# Patient Record
Sex: Male | Born: 1975 | Race: Black or African American | Hispanic: No | Marital: Single | Smoking: Former smoker
Health system: Southern US, Community
[De-identification: ages and names within clinical notes are randomized; demographics above are authoritative.]

## PROBLEM LIST (undated history)

## (undated) DIAGNOSIS — L0291 Cutaneous abscess, unspecified: Secondary | ICD-10-CM

---

## 2001-02-11 ENCOUNTER — Encounter: Payer: Self-pay | Admitting: General Surgery

## 2001-02-11 ENCOUNTER — Inpatient Hospital Stay (HOSPITAL_COMMUNITY): Admission: EM | Admit: 2001-02-11 | Discharge: 2001-02-23 | Payer: Self-pay

## 2001-02-12 ENCOUNTER — Encounter: Payer: Self-pay | Admitting: General Surgery

## 2001-02-13 ENCOUNTER — Encounter: Payer: Self-pay | Admitting: General Surgery

## 2001-02-14 ENCOUNTER — Encounter: Payer: Self-pay | Admitting: General Surgery

## 2001-02-15 ENCOUNTER — Encounter: Payer: Self-pay | Admitting: General Surgery

## 2001-02-16 ENCOUNTER — Encounter: Payer: Self-pay | Admitting: General Surgery

## 2001-02-17 ENCOUNTER — Encounter: Payer: Self-pay | Admitting: General Surgery

## 2001-02-18 ENCOUNTER — Encounter: Payer: Self-pay | Admitting: General Surgery

## 2001-03-01 ENCOUNTER — Encounter: Admission: RE | Admit: 2001-03-01 | Discharge: 2001-04-08 | Payer: Self-pay | Admitting: General Surgery

## 2001-03-02 ENCOUNTER — Ambulatory Visit (HOSPITAL_COMMUNITY): Admission: RE | Admit: 2001-03-02 | Discharge: 2001-03-02 | Payer: Self-pay

## 2001-03-07 ENCOUNTER — Encounter: Payer: Self-pay | Admitting: Neurosurgery

## 2001-03-07 ENCOUNTER — Encounter: Admission: RE | Admit: 2001-03-07 | Discharge: 2001-03-07 | Payer: Self-pay | Admitting: Neurosurgery

## 2001-04-22 ENCOUNTER — Encounter: Admission: RE | Admit: 2001-04-22 | Discharge: 2001-07-21 | Payer: Self-pay | Admitting: General Surgery

## 2001-09-13 ENCOUNTER — Encounter: Admission: RE | Admit: 2001-09-13 | Discharge: 2001-09-13 | Payer: Self-pay | Admitting: Neurosurgery

## 2001-09-13 ENCOUNTER — Encounter: Payer: Self-pay | Admitting: Neurosurgery

## 2011-06-20 ENCOUNTER — Emergency Department (HOSPITAL_COMMUNITY)
Admission: EM | Admit: 2011-06-20 | Discharge: 2011-06-20 | Disposition: A | Discharge status: Home / Self Care | Payer: Medicare Other | Attending: Emergency Medicine | Admitting: Emergency Medicine

## 2011-06-20 DIAGNOSIS — R109 Unspecified abdominal pain: Secondary | ICD-10-CM | POA: Insufficient documentation

## 2011-06-20 DIAGNOSIS — R1909 Other intra-abdominal and pelvic swelling, mass and lump: Secondary | ICD-10-CM | POA: Insufficient documentation

## 2011-06-20 DIAGNOSIS — L02219 Cutaneous abscess of trunk, unspecified: Secondary | ICD-10-CM | POA: Insufficient documentation

## 2012-03-16 ENCOUNTER — Emergency Department (HOSPITAL_COMMUNITY)
Admission: EM | Admit: 2012-03-16 | Discharge: 2012-03-16 | Disposition: A | Discharge status: Home / Self Care | Payer: Medicare Other | Attending: Emergency Medicine | Admitting: Emergency Medicine

## 2012-03-16 ENCOUNTER — Encounter (HOSPITAL_COMMUNITY): Payer: Self-pay | Admitting: *Deleted

## 2012-03-16 DIAGNOSIS — L02412 Cutaneous abscess of left axilla: Secondary | ICD-10-CM

## 2012-03-16 DIAGNOSIS — IMO0002 Reserved for concepts with insufficient information to code with codable children: Secondary | ICD-10-CM | POA: Insufficient documentation

## 2012-03-16 DIAGNOSIS — F17200 Nicotine dependence, unspecified, uncomplicated: Secondary | ICD-10-CM | POA: Insufficient documentation

## 2012-03-16 MED ORDER — HYDROCODONE-ACETAMINOPHEN 5-325 MG PO TABS: 1.0000 | ORAL_TABLET | ORAL | Status: AC | PRN

## 2012-03-16 MED ORDER — SULFAMETHOXAZOLE-TRIMETHOPRIM 800-160 MG PO TABS: 2.0000 | ORAL_TABLET | Freq: Two times a day (BID) | ORAL | Status: AC

## 2012-03-16 MED ORDER — CEPHALEXIN 500 MG PO CAPS: 500.0000 mg | ORAL_CAPSULE | Freq: Four times a day (QID) | ORAL | Status: AC

## 2012-03-16 NOTE — ED Notes (Signed)
Pt is here with abscess under left axilla for one week and not draining.   Oval shaped and red

## 2012-03-16 NOTE — Discharge Instructions (Signed)
Take antibiotic as prescribed.  Take vicodin as prescribed for severe pain.   Do not drive within four hours of taking this medication (may cause drowsiness or confusion).  Take tylenol for mild-moderate pain.  Follow up with your primary care doctor in 2 days for wound recheck and packing removal.  You should return sooner if the redness around the wound continues to spread despite taking the antibiotic. Abscess An abscess (boil or furuncle) is an infected area that contains a collection of pus.  SYMPTOMS Signs and symptoms of an abscess include pain, tenderness, redness, or hardness. You may feel a moveable soft area under your skin. An abscess can occur anywhere in the body.  TREATMENT  A surgical cut (incision) may be made over your abscess to drain the pus. Gauze may be packed into the space or a drain may be looped through the abscess cavity (pocket). This provides a drain that will allow the cavity to heal from the inside outwards. The abscess may be painful for a few days, but should feel much better if it was drained.  Your abscess, if seen early, may not have localized and may not have been drained. If not, another appointment may be required if it does not get better on its own or with medications. HOME CARE INSTRUCTIONS   Only take over-the-counter or prescription medicines for pain, discomfort, or fever as directed by your caregiver.   Take your antibiotics as directed if they were prescribed. Finish them even if you start to feel better.   Keep the skin and clothes clean around your abscess.   If the abscess was drained, you will need to use gauze dressing to collect any draining pus. Dressings will typically need to be changed 3 or more times a day.   The infection may spread by skin contact with others. Avoid skin contact as much as possible.   Practice good hygiene. This includes regular hand washing, cover any draining skin lesions, and do not share personal care items.   If  you participate in sports, do not share athletic equipment, towels, whirlpools, or personal care items. Shower after every practice or tournament.   If a draining area cannot be adequately covered:   Do not participate in sports.   Children should not participate in day care until the wound has healed or drainage stops.   If your caregiver has given you a follow-up appointment, it is very important to keep that appointment. Not keeping the appointment could result in a much worse infection, chronic or permanent injury, pain, and disability. If there is any problem keeping the appointment, you must call back to this facility for assistance.  SEEK MEDICAL CARE IF:   You develop increased pain, swelling, redness, drainage, or bleeding in the wound site.   You develop signs of generalized infection including muscle aches, chills, fever, or a general ill feeling.   You have an oral temperature above 102 F (38.9 C).  MAKE SURE YOU:   Understand these instructions.   Will watch your condition.   Will get help right away if you are not doing well or get worse.  Document Released: 09/16/2005 Document Revised: 11/26/2011 Document Reviewed: 07/10/2008 [facility] Patient Information 2012 [place], [place].Cellulitis Cellulitis is an infection of the skin and the tissue beneath it. The area is typically red and tender. It is caused by germs (bacteria) (usually staph or strep) that enter the body through cuts or sores. Cellulitis most commonly occurs in the arms or  lower legs.  HOME CARE INSTRUCTIONS   If you are given a prescription for medications which kill germs (antibiotics), take as directed until finished.   If the infection is on the arm or leg, keep the limb elevated as able.   Use a warm cloth several times per day to relieve pain and encourage healing.   See your caregiver for recheck of the infected site as directed if problems arise.   Only take over-the-counter or prescription  medicines for pain, discomfort, or fever as directed by your caregiver.  SEEK MEDICAL CARE IF:   The area of redness (inflammation) is spreading, there are red streaks coming from the infected site, or if a part of the infection begins to turn dark in color.   The joint or bone underneath the infected skin becomes painful after the skin has healed.   The infection returns in the same or another area after it seems to have gone away.   A boil or bump swells up. This may be an abscess.   New, unexplained problems such as pain or fever develop.  SEEK IMMEDIATE MEDICAL CARE IF:   You have a fever.   You or your child feels drowsy or lethargic.   There is vomiting, diarrhea, or lasting discomfort or feeling ill (malaise) with muscle aches and pains.  MAKE SURE YOU:   Understand these instructions.   Will watch your condition.   Will get help right away if you are not doing well or get worse.  Document Released: 09/16/2005 Document Revised: 11/26/2011 Document Reviewed: 07/25/2008 [facility] Patient Information 2012 [place], [place].

## 2012-03-16 NOTE — ED Provider Notes (Signed)
History     CSN: [id number]  Arrival date & time 03/16/12  4098   First MD Initiated Contact with Patient 03/16/12 [phone]      Chief Complaint  Patient presents with  . Abscess    (Consider location/radiation/quality/duration/timing/severity/associated sxs/prior treatment) HPI History provided by pt.   Pt presents w/ abscess of left axilla x 1-2 weeks.  Severely, constantly painful, particularly w/ palpation, but mild improvement w/ tylenol.  Has applied warm compresses but no drainage.  No associated fever.  Otherwise feeling well.    History reviewed. No pertinent past medical history.  History reviewed. No pertinent past surgical history.  No family history on file.  History  Substance Use Topics  . Smoking status: Current Everyday Smoker  . Smokeless tobacco: Not on file  . Alcohol Use:      occ      Review of Systems  All other systems reviewed and are negative.    Allergies  Review of patient's allergies indicates no known allergies.  Home Medications   Current Outpatient Rx  Name Route Sig Dispense Refill  . CEPHALEXIN 500 MG PO CAPS Oral Take 1 capsule (500 mg total) by mouth 4 (four) times daily. 28 capsule 0  . HYDROCODONE-ACETAMINOPHEN 5-325 MG PO TABS Oral Take 1 tablet by mouth every 4 (four) hours as needed for pain. 12 tablet 0  . SULFAMETHOXAZOLE-TRIMETHOPRIM 800-160 MG PO TABS Oral Take 2 tablets by mouth 2 (two) times daily. 28 tablet 0    BP 138/88  Pulse 81  Temp(Src) 97 F (36.1 C) (Oral)  Resp 18  SpO2 98%  Physical Exam  Nursing note and vitals reviewed. Constitutional: He is oriented to person, place, and time. He appears well-developed and well-nourished. No distress.  HENT:  Head: Normocephalic and atraumatic.  Eyes:       Normal appearance  Neck: Normal range of motion.  Neurological: He is alert and oriented to person, place, and time.  Skin:       2.5cm abscess left axilla w/ surrounding induration and approx 4 inches  surrounding erythema.    Psychiatric: He has a normal mood and affect. His behavior is normal.    ED Course  Procedures (including critical care time)  INCISION AND DRAINAGE Performed by: Otilio Miu Consent: Verbal consent obtained. Risks and benefits: risks, benefits and alternatives were discussed Type: abscess  Body area: left axilla  Anesthesia: local infiltration  Local anesthetic: lidocaine 2% w/ epinephrine  Anesthetic total: 6 ml  Complexity: complex Blunt dissection to break up loculations  Drainage: purulent  Drainage amount: small  Packing material: 1/4 in iodoform gauze  Patient tolerance: Patient tolerated the procedure well with no immediate complications.    Labs Reviewed - No data to display No results found.   1. Abscess of left axilla       MDM  Pt presents w/ abscess w/ surrounding cellulitis of left axilla.  Abscess I&D'd but very little drainage.  Pt has a PCP to f/u with in 2 days.  Prescribed bactrim/keflex as well as vicodin and return precautions discussed.          Otilio Miu, [place] 03/16/12 [phone]

## 2012-03-16 NOTE — ED Provider Notes (Signed)
Medical screening examination/treatment/procedure(s) were performed by non-physician practitioner and as supervising physician I was immediately available for consultation/collaboration.   Laray Anger, DO 03/16/12 2110

## 2012-12-13 ENCOUNTER — Emergency Department (HOSPITAL_COMMUNITY)
Admission: EM | Admit: 2012-12-13 | Discharge: 2012-12-13 | Disposition: A | Discharge status: Home / Self Care | Payer: Medicare Other | Attending: Emergency Medicine | Admitting: Emergency Medicine

## 2012-12-13 ENCOUNTER — Encounter (HOSPITAL_COMMUNITY): Payer: Self-pay | Admitting: Physical Medicine and Rehabilitation

## 2012-12-13 DIAGNOSIS — N509 Disorder of male genital organs, unspecified: Secondary | ICD-10-CM | POA: Insufficient documentation

## 2012-12-13 DIAGNOSIS — N492 Inflammatory disorders of scrotum: Secondary | ICD-10-CM

## 2012-12-13 DIAGNOSIS — F17200 Nicotine dependence, unspecified, uncomplicated: Secondary | ICD-10-CM | POA: Insufficient documentation

## 2012-12-13 DIAGNOSIS — N498 Inflammatory disorders of other specified male genital organs: Secondary | ICD-10-CM | POA: Insufficient documentation

## 2012-12-13 MED ORDER — CEPHALEXIN 500 MG PO CAPS: 500.0000 mg | ORAL_CAPSULE | Freq: Three times a day (TID) | ORAL | Status: DC

## 2012-12-13 MED ORDER — SULFAMETHOXAZOLE-TRIMETHOPRIM 800-160 MG PO TABS: 1.0000 | ORAL_TABLET | Freq: Three times a day (TID) | ORAL | Status: DC

## 2012-12-13 MED ORDER — OXYCODONE-ACETAMINOPHEN 5-325 MG PO TABS: 1.0000 | ORAL_TABLET | Freq: Four times a day (QID) | ORAL | Status: DC | PRN

## 2012-12-13 NOTE — ED Notes (Signed)
Pt presents to department for evaluation of bilateral testicular swelling. Ongoing x1 week. States pain became worse today. 10/10 at the time. Denies urinary symptoms.

## 2012-12-13 NOTE — ED Notes (Signed)
EDP at bedside. I&D of scrotum performed, pt tolerated without difficulty. Wound care discussed. Pt instructed to follow up with urologist x2 days.

## 2012-12-13 NOTE — ED Provider Notes (Signed)
History     CSN: [id number]  Arrival date & time 12/13/12  4098   First MD Initiated Contact with Patient 12/13/12 [phone]      Chief Complaint  Patient presents with  . Groin Swelling    (Consider location/radiation/quality/duration/timing/severity/associated sxs/prior treatment) The history is provided by the patient.   patient with scrotal swelling for the last week. Started on the left he states now involves the right. No trauma. There is a swollen area to the left side. No fevers. No dysuria. No penile discharge. No trauma. He has not had episodes like this before.  No past medical history on file.  No past surgical history on file.  History reviewed. No pertinent family history.  History  Substance Use Topics  . Smoking status: Current Some Day Smoker    Types: Cigarettes  . Smokeless tobacco: Not on file  . Alcohol Use: No     Comment: occ      Review of Systems  Constitutional: Negative for activity change and appetite change.  HENT: Negative for neck stiffness.   Eyes: Negative for pain.  Respiratory: Negative for chest tightness and shortness of breath.   Cardiovascular: Negative for chest pain and leg swelling.  Gastrointestinal: Negative for nausea, vomiting, abdominal pain and diarrhea.  Genitourinary: Positive for scrotal swelling. Negative for hematuria, flank pain, penile pain and testicular pain.  Musculoskeletal: Negative for back pain.  Skin: Negative for rash.  Neurological: Negative for weakness, numbness and headaches.  Psychiatric/Behavioral: Negative for behavioral problems.    Allergies  Review of patient's allergies indicates no known allergies.  Home Medications   Current Outpatient Rx  Name  Route  Sig  Dispense  Refill  . CEPHALEXIN 500 MG PO CAPS   Oral   Take 1 capsule (500 mg total) by mouth 3 (three) times daily.   15 capsule   0   . OXYCODONE-ACETAMINOPHEN 5-325 MG PO TABS   Oral   Take 1-2 tablets by mouth every 6 (six)  hours as needed for pain.   10 tablet   0   . SULFAMETHOXAZOLE-TRIMETHOPRIM 800-160 MG PO TABS   Oral   Take 1 tablet by mouth 3 (three) times daily.   15 tablet   0     BP 147/89  Pulse 81  Temp 98.8 F (37.1 C) (Oral)  Resp 18  SpO2 97%  Physical Exam  Nursing note and vitals reviewed. Constitutional: He is oriented to person, place, and time. He appears well-developed and well-nourished.  HENT:  Head: Normocephalic and atraumatic.  Eyes: EOM are normal. Pupils are equal, round, and reactive to light.  Neck: Normal range of motion. Neck supple.  Cardiovascular: Normal rate, regular rhythm and normal heart sounds.   No murmur heard. Pulmonary/Chest: Effort normal and breath sounds normal.  Abdominal: Soft. Bowel sounds are normal. He exhibits no distension and no mass. There is no tenderness. There is no rebound and no guarding.  Genitourinary:       2 cm x 4 cm tender indurated area to left proximal scrotum. There is approximately 1.56 cm fluctuant area. No drainage. Bilateral testicles have apparent normal lie and are nontender. No palpated hernias.  Musculoskeletal: Normal range of motion. He exhibits no edema.  Neurological: He is alert and oriented to person, place, and time. No cranial nerve deficit.  Skin: Skin is warm and dry.  Psychiatric: He has a normal mood and affect.    ED Course  Procedures (including critical care time)  Labs Reviewed - No data to display No results found.   1. Abscess of scrotum   2. Cellulitis of scrotum    INCISION AND DRAINAGE Performed by: Billee Cashing. Consent: Verbal consent obtained. Risks and benefits: risks, benefits and alternatives were discussed Type: abscess  Body area: Left scrotum  Anesthesia: local infiltration  Incision was made with a scalpel.  Local anesthetic: lidocaine 2 % Anesthetic total: 2 ml  Complexity: complex Blunt dissection to break up loculations  Drainage: purulent  Drainage  amount: Mild   Patient tolerance: Patient tolerated the procedure well with no immediate complications.     MDM  Patient with pain and swelling of left scrotal area. Apparent abscess with surrounding cellulitis. Testicle appears nontender. No palpated hernias. Patient had an incision and drainage of the abscess. Antibiotics and pain that started the patient will follow with urology in 2 days.        Juliet Rude. Rubin Payor, MD 12/13/12 1040

## 2013-01-11 ENCOUNTER — Encounter (HOSPITAL_COMMUNITY): Payer: Self-pay | Admitting: Cardiology

## 2013-01-11 ENCOUNTER — Emergency Department (HOSPITAL_COMMUNITY): Payer: Medicare Other

## 2013-01-11 ENCOUNTER — Emergency Department (HOSPITAL_COMMUNITY)
Admission: EM | Admit: 2013-01-11 | Discharge: 2013-01-11 | Disposition: A | Discharge status: Home / Self Care | Payer: Medicare Other | Attending: Emergency Medicine | Admitting: Emergency Medicine

## 2013-01-11 DIAGNOSIS — F17200 Nicotine dependence, unspecified, uncomplicated: Secondary | ICD-10-CM | POA: Insufficient documentation

## 2013-01-11 DIAGNOSIS — N492 Inflammatory disorders of scrotum: Secondary | ICD-10-CM

## 2013-01-11 DIAGNOSIS — N498 Inflammatory disorders of other specified male genital organs: Secondary | ICD-10-CM | POA: Insufficient documentation

## 2013-01-11 MED ORDER — CEPHALEXIN 500 MG PO CAPS: ORAL_CAPSULE | ORAL | Status: DC

## 2013-01-11 MED ORDER — MORPHINE SULFATE 4 MG/ML IJ SOLN
4.0000 mg | Freq: Once | INTRAMUSCULAR | Status: AC
  Administered 2013-01-11: 4 mg via INTRAVENOUS
  Filled 2013-01-11: qty 1

## 2013-01-11 MED ORDER — PERCOCET 5-325 MG PO TABS: 1.0000 | ORAL_TABLET | Freq: Four times a day (QID) | ORAL | Status: DC | PRN

## 2013-01-11 MED ORDER — VANCOMYCIN HCL IN DEXTROSE 1-5 GM/200ML-% IV SOLN
1000.0000 mg | Freq: Once | INTRAVENOUS | Status: AC
  Administered 2013-01-11: 1000 mg via INTRAVENOUS
  Filled 2013-01-11: qty 200

## 2013-01-11 MED ORDER — ONDANSETRON HCL 4 MG/2ML IJ SOLN
4.0000 mg | Freq: Once | INTRAMUSCULAR | Status: AC
  Administered 2013-01-11: 4 mg via INTRAVENOUS
  Filled 2013-01-11: qty 2

## 2013-01-11 NOTE — ED Provider Notes (Signed)
History     CSN: [id number]  Arrival date & time 01/11/13  0808   First MD Initiated Contact with Patient 01/11/13 [phone]      Chief Complaint  Patient presents with  . Abcess     (Consider location/radiation/quality/duration/timing/severity/associated sxs/prior treatment) HPI Comments: Logan Rodriguez is a 37 y.o. male presents emergency department complaining of left testicular pain.  The patient was evaluated for similar presentation on December 24.  At that time incision and drainage was performed with only scant draining.  He was placed on antibiotics and was to followup with urology which the patient was noncompliant as he was feeling better from pain medication and antibiotics.  Onset of current presentation began 2-3 days ago.  No known exacerbating or alleviating factors.  Symptoms are moderate.  Patient denies radiation of pain.  Patient reports that it feels similar to last time.  He denies any drainage, penile discharge, penile tenderness, hematuria, urinary frequency, fevers, night sweats or chills.  The history is provided by the patient.    History reviewed. No pertinent past medical history.  History reviewed. No pertinent past surgical history.  History reviewed. No pertinent family history.  History  Substance Use Topics  . Smoking status: Current Some Day Smoker    Types: Cigarettes  . Smokeless tobacco: Not on file  . Alcohol Use: No     Comment: occ      Review of Systems  Constitutional: Negative for fever, diaphoresis and activity change.  HENT: Negative for congestion and neck pain.   Respiratory: Negative for cough.   Genitourinary: Negative for dysuria.  Musculoskeletal: Negative for myalgias.  Skin: Negative for color change and wound.  Neurological: Negative for headaches.  All other systems reviewed and are negative.    Allergies  Review of patient's allergies indicates no known allergies.  Home Medications  No current outpatient  prescriptions on file.  BP 154/88  Pulse 91  Temp 97.7 F (36.5 C) (Oral)  Resp 18  SpO2 97%  Physical Exam  Nursing note and vitals reviewed. Constitutional: He is oriented to person, place, and time. He appears well-developed and well-nourished. No distress.  HENT:  Head: Normocephalic and atraumatic.  Eyes: Conjunctivae normal and EOM are normal.  Neck: Normal range of motion.  Pulmonary/Chest: Effort normal.  Genitourinary:       Exam chaperoned.  Penis normal and circumcised.  2 x 1 cm tender indurated area at the left proximal scrotum with I&D scar above.  No area of palpable fluctuance.  No drainage.  Bilateral testicles nontender with intact cremaster reflex.  No palpable inguinal hernias.  Musculoskeletal: Normal range of motion.  Neurological: He is alert and oriented to person, place, and time.  Skin: Skin is warm and dry. No rash noted. He is not diaphoretic.  Psychiatric: He has a normal mood and affect. His behavior is normal.    ED Course  Procedures (including critical care time)  Labs Reviewed - No data to display US Scrotum  01/11/2013  *RADIOLOGY REPORT*  Clinical Data:  Scrotal pain and swelling.  SCROTAL ULTRASOUND DOPPLER ULTRASOUND OF THE TESTICLES  Technique: Complete ultrasound examination of the testicles, epididymis, and other scrotal structures was performed.  Color and spectral Doppler ultrasound were also utilized to evaluate blood flow to the testicles.  Comparison:  None  Findings:  Right testis:  Measures 3.8 x 2.1 x 3.1cm.  Normal homogeneous echogenicity without focal lesions.  Patent intratesticular blood flow.  There is a small,  2.1 mm, tunica albuginea cyst.  Left testis:  Measures 3.4 x 1.9 x 3.4cm.  Normal homogeneous echogenicity without focal lesions.  Patent intratesticular blood flow  Right epididymis:  Normal in size and appearance.  Left epididymis:  Normal in size and appearance.  Hydrocele:  None  Varicocele:  None  Pulsed Doppler  interrogation of both testes demonstrates low resistance flow bilaterally.  IMPRESSION:  1.  Normal sonographic appearance of both testicles except for a small tunica albuginea cyst on the right. 2.  Patent intra testicular blood flow bilaterally. 3.  Normal sonographic appearance of the epididymi. 4.  No hydrocele or varicocele.   Original Report Authenticated By: Rudie Meyer, M.D.    [name] Art/ven Flow Abd Pelv Doppler  01/11/2013  *RADIOLOGY REPORT*  Clinical Data:  Scrotal pain and swelling.  SCROTAL ULTRASOUND DOPPLER ULTRASOUND OF THE TESTICLES  Technique: Complete ultrasound examination of the testicles, epididymis, and other scrotal structures was performed.  Color and spectral Doppler ultrasound were also utilized to evaluate blood flow to the testicles.  Comparison:  None  Findings:  Right testis:  Measures 3.8 x 2.1 x 3.1cm.  Normal homogeneous echogenicity without focal lesions.  Patent intratesticular blood flow.  There is a small, 2.1 mm, tunica albuginea cyst.  Left testis:  Measures 3.4 x 1.9 x 3.4cm.  Normal homogeneous echogenicity without focal lesions.  Patent intratesticular blood flow  Right epididymis:  Normal in size and appearance.  Left epididymis:  Normal in size and appearance.  Hydrocele:  None  Varicocele:  None  Pulsed Doppler interrogation of both testes demonstrates low resistance flow bilaterally.  IMPRESSION:  1.  Normal sonographic appearance of both testicles except for a small tunica albuginea cyst on the right. 2.  Patent intra testicular blood flow bilaterally. 3.  Normal sonographic appearance of the epididymi. 4.  No hydrocele or varicocele.   Original Report Authenticated By: Rudie Meyer, M.D.      No diagnosis found.  Consult Urology: Dr. Sherron Monday ultrasound of scrotum and IV dose of vancomycin while in the emergency department.  Consult was called back after resulted without acute abnormalities that were concerning.  Patient is to be discharged on Keflex with  pain medication and followup with urology on Friday.  BP 154/88  Pulse 91  Temp 97.7 F (36.5 C) (Oral)  Resp 18  SpO2 97%  MDM  Abscess, no fluctuance  37 year old nonseptic and nontoxic appearing male to the emergency department complaining of left testicular pain.  Area of induration noted on exam and left proximal scrotum, however there is no area of fluctuance or draining.  Ultrasound reviewed as above.  Consults urology as above.  Patient has been instructed that if he has worsening symptoms or drainage develops that he needs to call Dr. Lafayette Dragon office to schedule an earlier followup appointment.  Patient is also been educated on home care therapies including warm compresses 3 times daily.  Questions answered.        Jaci Carrel, [place] 01/11/13 1011

## 2013-01-11 NOTE — ED Notes (Signed)
Pt reports abcess with swelling to the left groin. States he was here prior to Christmas with the same symptoms. Reports swelling returned over the past couple of days.

## 2013-01-11 NOTE — ED Provider Notes (Signed)
Medical screening examination/treatment/procedure(s) were performed by non-physician practitioner and as supervising physician I was immediately available for consultation/collaboration.  Audriana Aldama R. Bengie Kaucher, MD 01/11/13 1602 

## 2013-03-31 IMAGING — US US ART/VEN ABD/PELV/SCROTUM DOPPLER LTD
1 series · 14 of 25 positions shown · non-contrast
Comparison: None

CLINICAL DATA: Scrotal pain and swelling.

SCROTAL ULTRASOUND
DOPPLER ULTRASOUND OF THE TESTICLES
TECHNIQUE: Complete ultrasound examination of the testicles,
epididymis, and other scrotal structures was performed.  Color and
spectral Doppler ultrasound were also utilized to evaluate blood
flow to the testicles.

[Series 1: us art/ven abd/pelv/scrotum doppler ltd · 0.08mm/px · 14 of 52 slices shown]
[im 1/52]
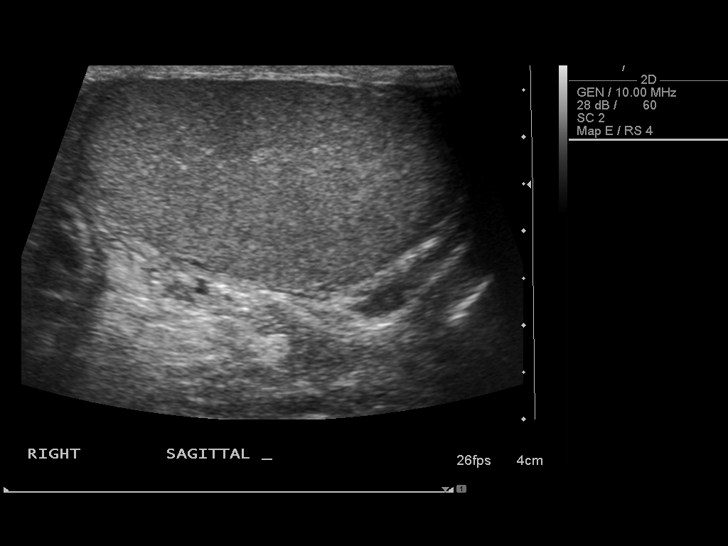
[im 5/52]
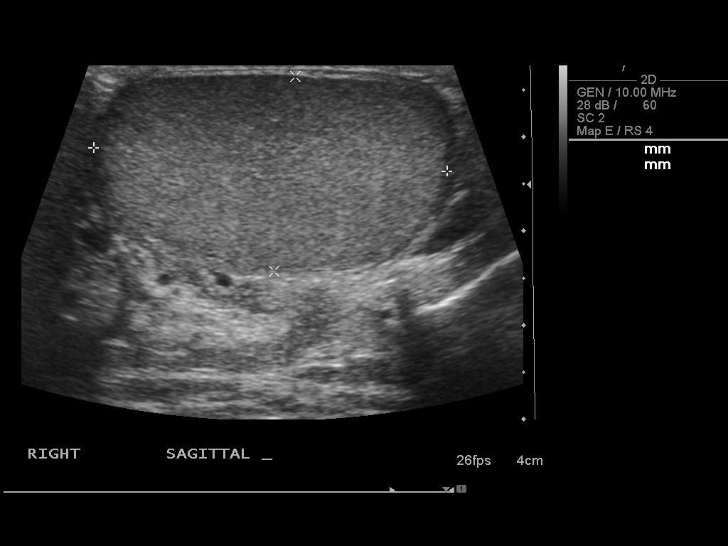
[im 9/52]
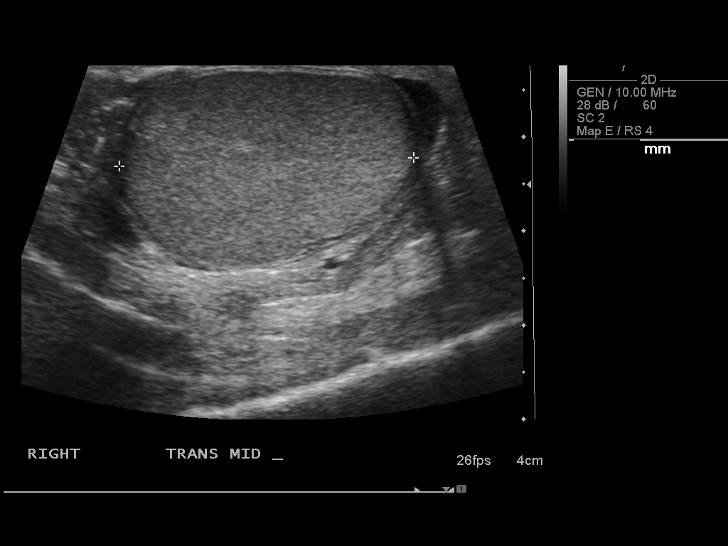
[im 13/52]
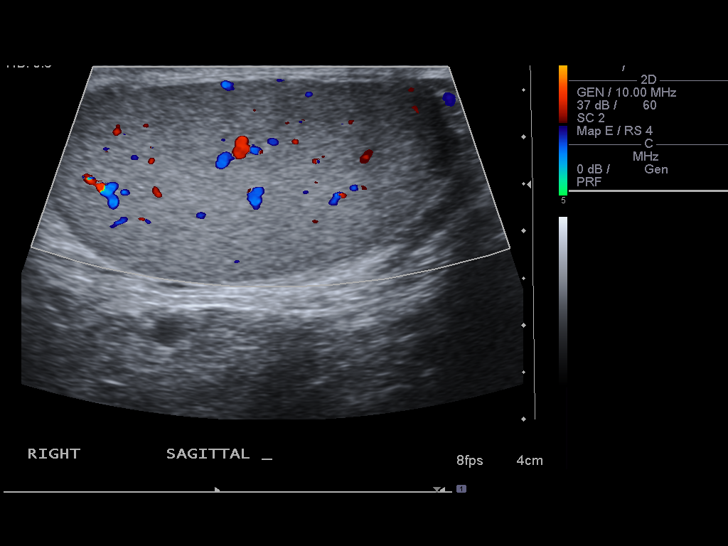
[im 18/52]
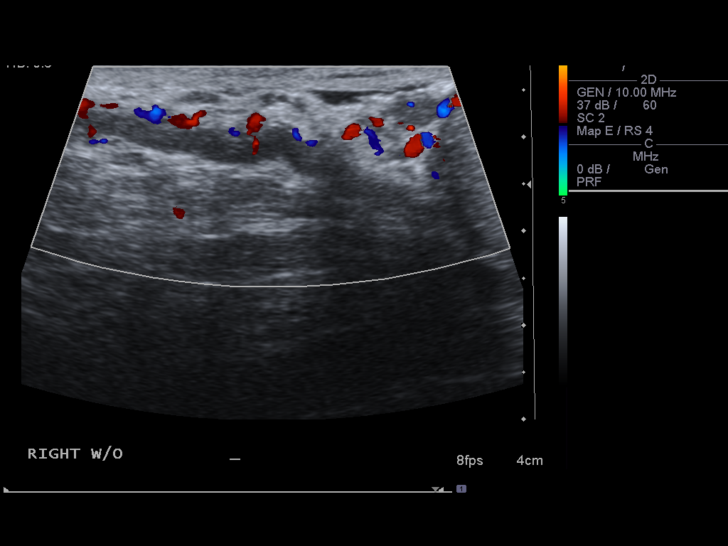
[im 20/52]
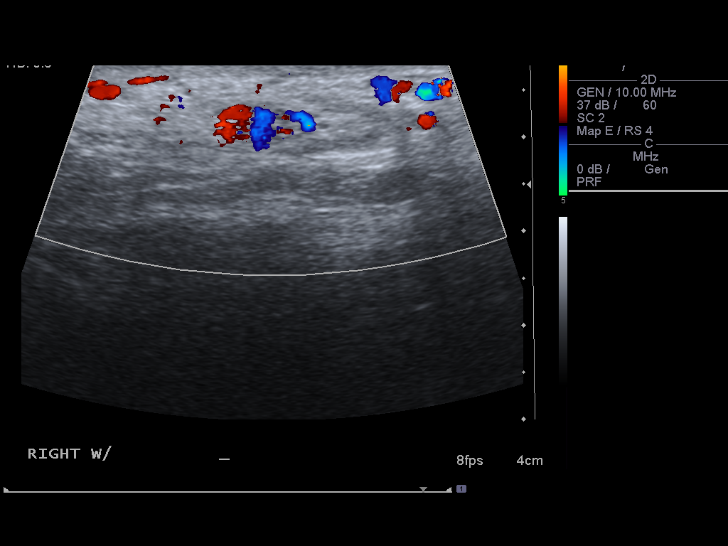
[im 24/52]
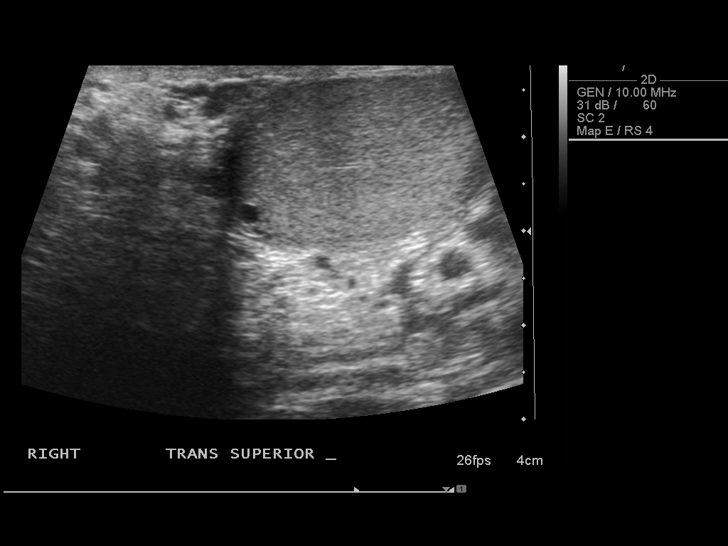
[im 28/52]
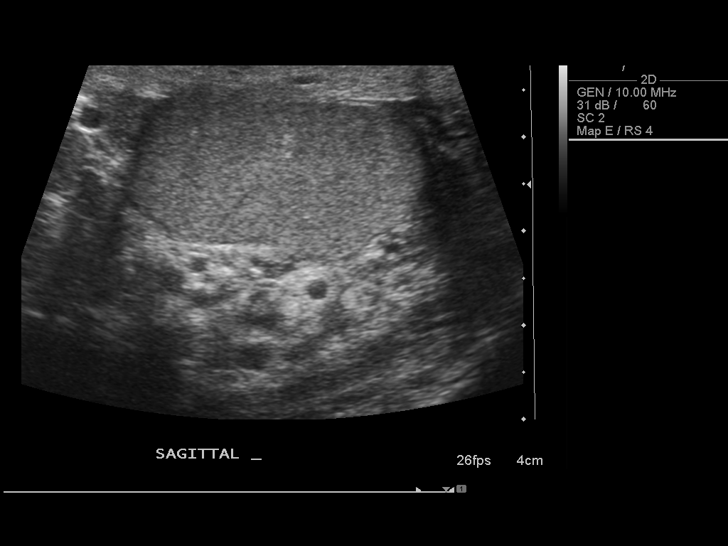
[im 32/52]
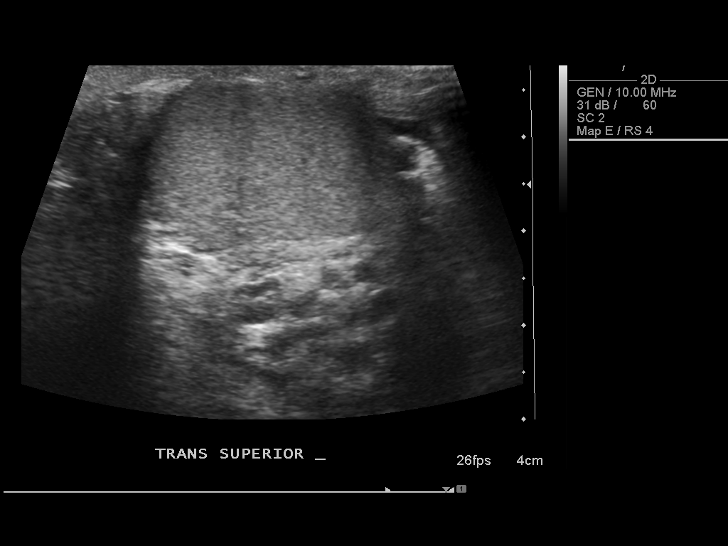
[im 35/52]
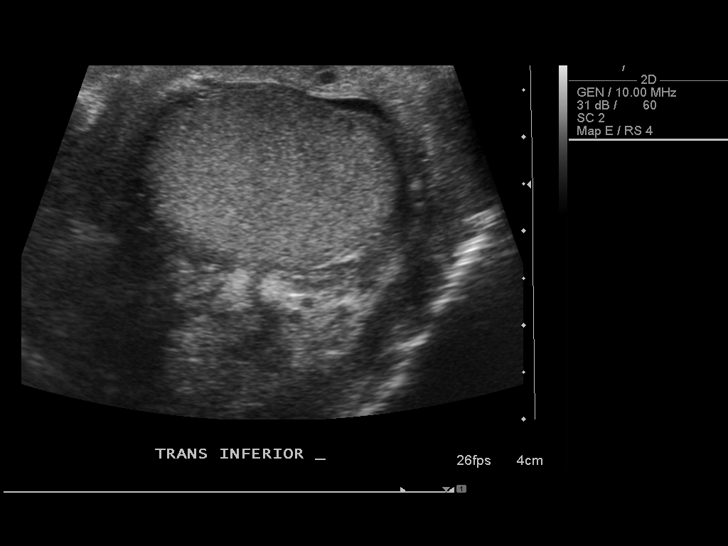
[im 39/52]
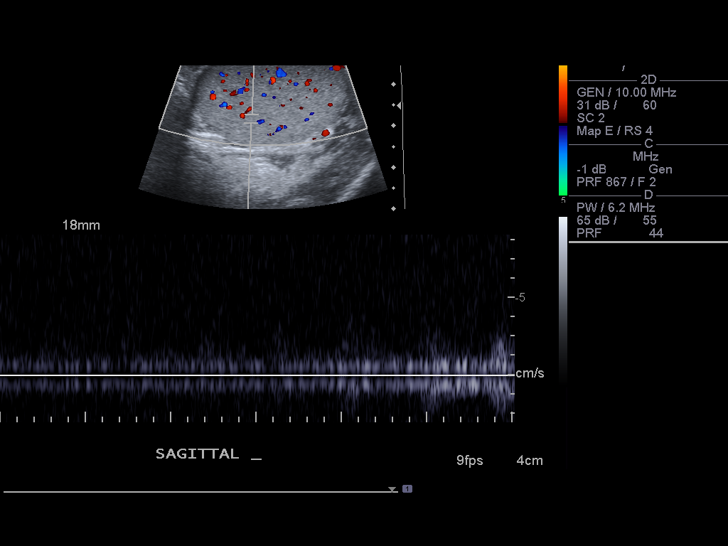
[im 43/52]
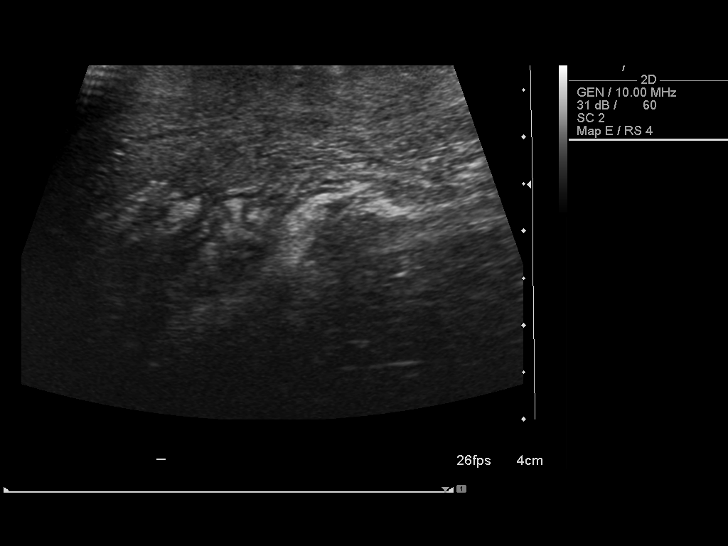
[im 47/52]
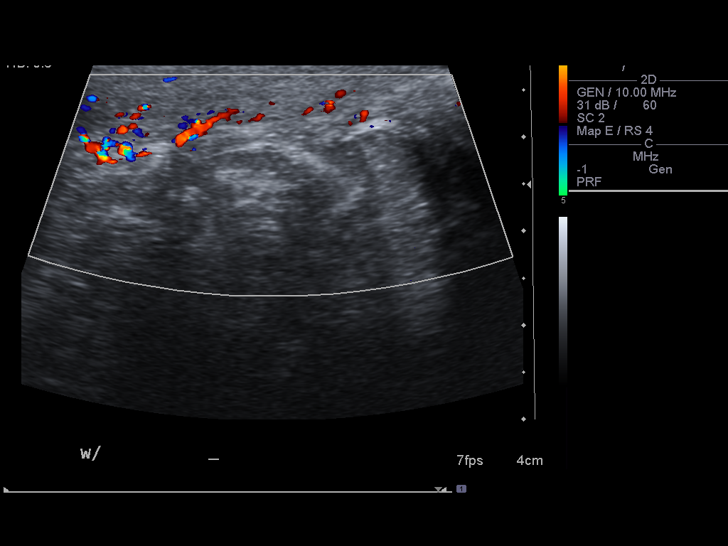
[im 52/52]
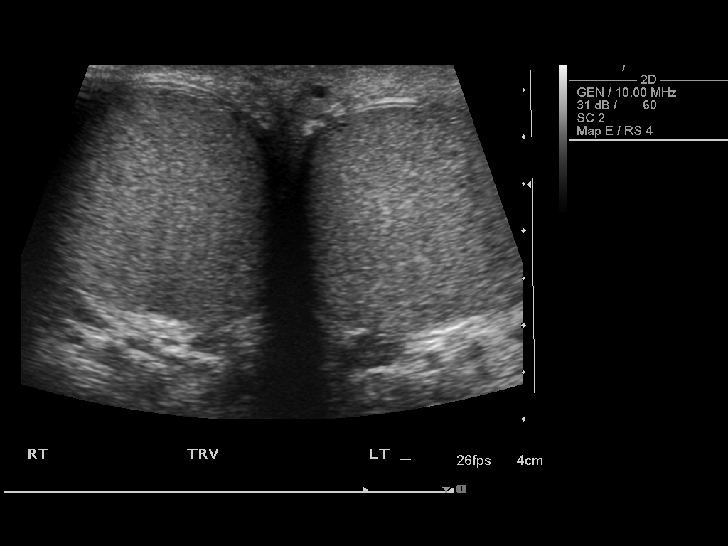

[14 of 25 positions shown; findings below may reference images not displayed]

FINDINGS: Right testis:  Measures 3.8 x 2.1 x 3.1cm.  Normal homogeneous
echogenicity without focal lesions.  Patent intratesticular blood
flow.  There is a small, 2.1 mm, tunica albuginea cyst.

Left testis:  Measures 3.4 x 1.9 x 3.4cm.  Normal homogeneous
echogenicity without focal lesions.  Patent intratesticular blood
flow

Right epididymis:  Normal in size and appearance.

Left epididymis:  Normal in size and appearance.

Hydrocele:  None

Varicocele:  None

Pulsed Doppler interrogation of both testes demonstrates low
resistance flow bilaterally.
IMPRESSION: 1.  Normal sonographic appearance of both testicles except for a
small tunica albuginea cyst on the right.
2.  Patent intra testicular blood flow bilaterally.
3.  Normal sonographic appearance of the epididymi.
4.  No hydrocele or varicocele.

## 2013-08-27 ENCOUNTER — Emergency Department (HOSPITAL_COMMUNITY)
Admission: EM | Admit: 2013-08-27 | Discharge: 2013-08-27 | Disposition: A | Discharge status: Home / Self Care | Payer: Medicare Other | Attending: Emergency Medicine | Admitting: Emergency Medicine

## 2013-08-27 ENCOUNTER — Encounter (HOSPITAL_COMMUNITY): Payer: Self-pay | Admitting: *Deleted

## 2013-08-27 DIAGNOSIS — L02219 Cutaneous abscess of trunk, unspecified: Secondary | ICD-10-CM | POA: Insufficient documentation

## 2013-08-27 DIAGNOSIS — F17200 Nicotine dependence, unspecified, uncomplicated: Secondary | ICD-10-CM | POA: Insufficient documentation

## 2013-08-27 DIAGNOSIS — L02211 Cutaneous abscess of abdominal wall: Secondary | ICD-10-CM

## 2013-08-27 MED ORDER — HYDROCODONE-ACETAMINOPHEN 5-325 MG PO TABS: 2.0000 | ORAL_TABLET | Freq: Four times a day (QID) | ORAL | Status: DC | PRN

## 2013-08-27 NOTE — ED Notes (Signed)
Pt has left lower suprapubic abscess with redness, swelling, and pain

## 2013-08-27 NOTE — ED Provider Notes (Signed)
CSN: [id number]     Arrival date & time 08/27/13  0830 History   First MD Initiated Contact with Patient 08/27/13 [phone]     Chief Complaint  Patient presents with  . Abscess   (Consider location/radiation/quality/duration/timing/severity/associated sxs/prior Treatment) HPI This 37 year old male is a prior skin abscesses drained in the past, he now has several days of left lower abdominal wall abscess without radiation of pain without associated symptoms without fever without vomiting without diffuse abdominal pain without pus drainage, his pain is moderately severe well localized, no treatment prior to arrival. His pain is sharp severe worse with palpation without other symptoms. History reviewed. No pertinent past medical history. History reviewed. No pertinent past surgical history. No family history on file. History  Substance Use Topics  . Smoking status: Current Some Day Smoker    Types: Cigarettes  . Smokeless tobacco: Not on file  . Alcohol Use: No     Comment: occ    Review of Systems 10 Systems reviewed and are negative for acute change except as noted in the HPI. Allergies  Review of patient's allergies indicates no known allergies.  Home Medications   Current Outpatient Rx  Name  Route  Sig  Dispense  Refill  . ibuprofen (ADVIL,MOTRIN) 200 MG tablet   Oral   Take 800 mg by mouth every 6 (six) hours as needed for pain.         Marland Kitchen HYDROcodone-acetaminophen (NORCO) 5-325 MG per tablet   Oral   Take 2 tablets by mouth every 6 (six) hours as needed for pain.   10 tablet   0    BP 157/85  Pulse 84  Temp(Src) 97.2 F (36.2 C) (Oral)  Resp 18  SpO2 99% Physical Exam  Nursing note and vitals reviewed. Constitutional:  Awake, alert, nontoxic appearance.  HENT:  Head: Atraumatic.  Eyes: Right eye exhibits no discharge. Left eye exhibits no discharge.  Neck: Neck supple.  Cardiovascular: Normal rate and regular rhythm.   No murmur heard. Pulmonary/Chest: Effort  normal and breath sounds normal. No respiratory distress. He has no wheezes. He has no rales. He exhibits no tenderness.  Abdominal: Soft. Bowel sounds are normal. He exhibits no distension. There is tenderness. There is no rebound and no guarding.  The patient's abdomen is nontender except for as well localized left lower abdominal wall abscess with localized fluctuance localized tenderness without surrounding erythema or excessive warmth to suggest surrounding cellulitis; limited bedside ultrasound reveals localized subcutaneous fluid collection consistent with abscess approximately 3 cm diameter left lower abdominal wall  Musculoskeletal: He exhibits no tenderness.  Baseline ROM, no obvious new focal weakness.  Neurological:  Mental status and motor strength appears baseline for patient and situation.  Skin: No rash noted.  Psychiatric: He has a normal mood and affect.    ED Course  Procedures (including critical care time) INCISION AND DRAINAGE Performed by: Hurman Horn Consent: Verbal consent obtained. Risks and benefits: risks, benefits and alternatives were discussed Time out performed prior to procedure Type: abscess Body area: left lower abdominal wall Anesthesia: local infiltration Incision was made with a scalpel. Local anesthetic: lidocaine 2% with epinephrine Anesthetic total: 7 ml Complexity: complex Blunt dissection to break up loculations Drainage: purulent Drainage amount: copious Packing material: none Patient tolerance: Patient tolerated the procedure well with no immediate complications.   Labs Review Labs Reviewed - No data to display Imaging Review No results found.  MDM   1. Abscess of abdominal wall  I doubt any other EMC precluding discharge at this time including, but not necessarily limited to the following: Cellulitis, sepsis.   Hurman Horn, MD 08/28/13 [phone]

## 2013-10-30 ENCOUNTER — Emergency Department (HOSPITAL_COMMUNITY)
Admission: EM | Admit: 2013-10-30 | Discharge: 2013-10-30 | Disposition: A | Discharge status: Home / Self Care | Payer: Medicare Other | Attending: Emergency Medicine | Admitting: Emergency Medicine

## 2013-10-30 ENCOUNTER — Emergency Department (HOSPITAL_COMMUNITY): Payer: Medicare Other

## 2013-10-30 ENCOUNTER — Encounter (HOSPITAL_COMMUNITY): Payer: Self-pay | Admitting: Emergency Medicine

## 2013-10-30 DIAGNOSIS — F17200 Nicotine dependence, unspecified, uncomplicated: Secondary | ICD-10-CM | POA: Insufficient documentation

## 2013-10-30 DIAGNOSIS — L02214 Cutaneous abscess of groin: Secondary | ICD-10-CM

## 2013-10-30 DIAGNOSIS — L02219 Cutaneous abscess of trunk, unspecified: Secondary | ICD-10-CM | POA: Insufficient documentation

## 2013-10-30 LAB — CBC WITH DIFFERENTIAL/PLATELET
Basophils Relative: 0 % (ref 0–1)
Eosinophils Absolute: 0.1 10*3/uL (ref 0.0–0.7)
Eosinophils Relative: 1 % (ref 0–5)
Lymphs Abs: 1.5 10*3/uL (ref 0.7–4.0)
MCH: 29.4 pg (ref 26.0–34.0)
MCHC: 34.4 g/dL (ref 30.0–36.0)
MCV: 85.6 fL (ref 78.0–100.0)
Monocytes Relative: 6 % (ref 3–12)
Neutrophils Relative %: 75 % (ref 43–77)
Platelets: 270 10*3/uL (ref 150–400)
RBC: 4.52 MIL/uL (ref 4.22–5.81)

## 2013-10-30 LAB — BASIC METABOLIC PANEL
BUN: 11 mg/dL (ref 6–23)
Calcium: 9.4 mg/dL (ref 8.4–10.5)
GFR calc Af Amer: >90 mL/min (ref >90)
GFR calc non Af Amer: >90 mL/min (ref >90)
Glucose, Bld: 91 mg/dL (ref 70–99)
Sodium: 137 mEq/L (ref 135–145)

## 2013-10-30 MED ORDER — VANCOMYCIN HCL IN DEXTROSE 1-5 GM/200ML-% IV SOLN
1000.0000 mg | Freq: Once | INTRAVENOUS | Status: AC
  Administered 2013-10-30: 1000 mg via INTRAVENOUS
  Filled 2013-10-30: qty 200

## 2013-10-30 MED ORDER — IOHEXOL 300 MG/ML  SOLN
100.0000 mL | Freq: Once | INTRAMUSCULAR | Status: AC | PRN
  Administered 2013-10-30: 100 mL via INTRAVENOUS

## 2013-10-30 MED ORDER — HYDROMORPHONE HCL PF 1 MG/ML IJ SOLN
1.0000 mg | Freq: Once | INTRAMUSCULAR | Status: AC
  Administered 2013-10-30: 1 mg via INTRAVENOUS
  Filled 2013-10-30: qty 1

## 2013-10-30 MED ORDER — SULFAMETHOXAZOLE-TRIMETHOPRIM 800-160 MG PO TABS: 1.0000 | ORAL_TABLET | Freq: Two times a day (BID) | ORAL | Status: DC

## 2013-10-30 MED ORDER — ONDANSETRON HCL 4 MG/2ML IJ SOLN
4.0000 mg | Freq: Once | INTRAMUSCULAR | Status: AC
  Administered 2013-10-30: 4 mg via INTRAVENOUS
  Filled 2013-10-30: qty 2

## 2013-10-30 MED ORDER — HYDROCODONE-ACETAMINOPHEN 5-325 MG PO TABS: 1.0000 | ORAL_TABLET | ORAL | Status: DC | PRN

## 2013-10-30 MED ORDER — SODIUM CHLORIDE 0.9 % IV SOLN
INTRAVENOUS | Status: DC
  Administered 2013-10-30: 09:00:00 via INTRAVENOUS

## 2013-10-30 NOTE — ED Provider Notes (Signed)
CSN: [id number]     Arrival date & time 10/30/13  0732 History   First MD Initiated Contact with Patient 10/30/13 [phone]     Chief Complaint  Patient presents with  . Abscess   (Consider location/radiation/quality/duration/timing/severity/associated sxs/prior Treatment) HPI Comments: Logan Rodriguez is a 37 y.o. male who complains of left groin swelling for several weeks, worsening in the last one week. He has previously had an abscess drained from this site. He denies fever, chills, nausea, vomiting, weakness, or dizziness. He has increased pain with movement and palpation to the left groin. There are no other known modifying factors.   Patient is a 37 y.o. male presenting with abscess. The history is provided by the patient.  Abscess   History reviewed. No pertinent past medical history. History reviewed. No pertinent past surgical history. No family history on file. History  Substance Use Topics  . Smoking status: Current Some Day Smoker    Types: Cigarettes  . Smokeless tobacco: Not on file  . Alcohol Use: Yes     Comment: occ    Review of Systems  All other systems reviewed and are negative.    Allergies  Review of patient's allergies indicates no known allergies.  Home Medications   Current Outpatient Rx  Name  Route  Sig  Dispense  Refill  . HYDROcodone-acetaminophen (NORCO) 5-325 MG per tablet   Oral   Take 1 tablet by mouth every 4 (four) hours as needed.   20 tablet   0   . sulfamethoxazole-trimethoprim (SEPTRA DS) 800-160 MG per tablet   Oral   Take 1 tablet by mouth every 12 (twelve) hours.   10 tablet   0    BP 122/76  Pulse 65  Temp(Src) 97.9 F (36.6 C) (Oral)  Resp 18  Ht 5\' 5"  (1.651 m)  Wt 195 lb (88.451 kg)  BMI 32.45 kg/m2  SpO2 97% Physical Exam  Nursing note and vitals reviewed. Constitutional: He is oriented to person, place, and time. He appears well-developed and well-nourished.  HENT:  Head: Normocephalic and atraumatic.   Right Ear: External ear normal.  Left Ear: External ear normal.  Eyes: Conjunctivae and EOM are normal. Pupils are equal, round, and reactive to light.  Neck: Normal range of motion and phonation normal. Neck supple.  Cardiovascular: Normal rate.   Pulmonary/Chest: Effort normal. He exhibits no bony tenderness.  Abdominal: Normal appearance.  Musculoskeletal: Normal range of motion. He exhibits no edema and no tenderness.  Neurological: He is alert and oriented to person, place, and time. No cranial nerve deficit or sensory deficit. He exhibits normal muscle tone. Coordination normal.  Skin: Skin is warm, dry and intact.  Left groin with several, irregular, firm, masses; with mild associated erythema, but no fluctuance. He has chronic skin changes, consistent with dermatitis of bilateral groin. There is a well-healed surgical incision scar above the area, involving the left groin, today.  Psychiatric: He has a normal mood and affect. His behavior is normal. Judgment and thought content normal.    ED Course  Procedures (including critical care time)  The patient has an irregular, small area of the left groin without distinct area for drainage. Additionally, I cannot ascertain the depth of this abnormality. A CT scan with IV contrast is ordered to assess the left groin for deep infection or drainable abscess.  Abscess present on CT scan  I&D done by PA, see attached note   Labs Review Labs Reviewed  CBC WITH DIFFERENTIAL -  Abnormal; Notable for the following:    HCT 38.7 (*)    All other components within normal limits  BASIC METABOLIC PANEL   Imaging Review Ct Pelvis W Contrast  10/30/2013   CLINICAL DATA:  Left groin pain. Soft tissue swelling. Patient states abscess drained in same area of few months ago.  EXAM: CT PELVIS WITH CONTRAST  TECHNIQUE: Multidetector CT imaging of the pelvis was performed using the standard protocol following the bolus administration of intravenous  contrast.  CONTRAST:  OMNIPAQUE IOHEXOL 300 MG/ML  SOLN  COMPARISON:  No comparison CT available for review. Testicular sonogram 01/11/2013.  FINDINGS: Cellulitis left inguinal region with subcutaneous 2.7 x 4.2 x 2.3 cm abscess. Surrounding adenopathy left inguinal region and left external iliac region.  No deep drainable abscess within surrounding musculature or pelvis proper.  No bowel containing inguinal hernia.  Prominent vascular structures within the scrotum greater on the left. Varicocele not excluded.  L5-S1 degenerative changes.  Non contrast filled views of the urinary bladder unremarkable. Slightly lobulated appearance of the prostate gland. Clinical and laboratory correlation recommended.  IMPRESSION: Cellulitis left inguinal region with subcutaneous 2.7 x 4.2 x 2.3 cm abscess. Surrounding adenopathy left inguinal region and left external iliac region.  Please see above.   Electronically Signed   By: Bridgett Larsson M.D.   On: 10/30/2013 10:02    EKG Interpretation   None       MDM   1. Abscess of groin, left    Recurrent groin abscess. No complicating factors. No metabolic instability, or suspected systemic infection. Mild surrounding abscess. Will require ongoing management with antibiotics. He had a wick placed after I&D that can be removed by the patient..  Nursing Notes Reviewed/ Care Coordinated, and agree without changes. Applicable Imaging Reviewed.  Interpretation of Laboratory Data incorporated into ED treatment   Plan: Home Medications- Septra, Norco; Home Treatments and Observation- warm compresses; return here if the recommended treatment, does not improve the symptoms; Recommended follow up- PCP of choice when necessary      Flint Melter, MD 10/30/13 1702

## 2013-10-30 NOTE — ED Provider Notes (Signed)
INCISION AND DRAINAGE Performed by: Cherrie Distance C. Consent: Verbal consent obtained. Risks and benefits: risks, benefits and alternatives were discussed Type: abscess  Body area: left groin/inguinal  Anesthesia: local infiltration  Incision was made with a scalpel.  Local anesthetic: lidocaine 2% without epinephrine  Anesthetic total: 4 ml  Complexity: complex Blunt dissection to break up loculations  Drainage: purulent  Drainage amount: large  Packing material: 1 in iodoform gauze  Patient tolerance: Patient tolerated the procedure well with no immediate complications.    Izola Price Marisue Humble, PA-C 10/30/13 1424

## 2013-10-30 NOTE — ED Provider Notes (Signed)
Medical screening examination/treatment/procedure(s) were conducted as a shared visit with non-physician practitioner(s) and myself.  I personally evaluated the patient during the encounter  Flint Melter, MD 10/30/13 [phone]

## 2013-10-30 NOTE — ED Notes (Signed)
PA at bedside.

## 2013-10-30 NOTE — ED Notes (Signed)
Patient with edema in left groin x a couple of months, patient states abscess drained in same area approx a few months ago, patient states now with pain with movement, no drainage at this time

## 2013-11-01 ENCOUNTER — Emergency Department (HOSPITAL_COMMUNITY)
Admission: EM | Admit: 2013-11-01 | Discharge: 2013-11-01 | Disposition: A | Discharge status: Home / Self Care | Payer: Medicare Other | Attending: Emergency Medicine | Admitting: Emergency Medicine

## 2013-11-01 ENCOUNTER — Encounter (HOSPITAL_COMMUNITY): Payer: Self-pay | Admitting: Emergency Medicine

## 2013-11-01 DIAGNOSIS — L02219 Cutaneous abscess of trunk, unspecified: Secondary | ICD-10-CM | POA: Insufficient documentation

## 2013-11-01 DIAGNOSIS — F17200 Nicotine dependence, unspecified, uncomplicated: Secondary | ICD-10-CM | POA: Insufficient documentation

## 2013-11-01 DIAGNOSIS — L02214 Cutaneous abscess of groin: Secondary | ICD-10-CM

## 2013-11-01 NOTE — ED Provider Notes (Signed)
CSN: [id number]     Arrival date & time 11/01/13  1426 History  This chart was scribed for non-physician practitioner Dierdre Forth, PA-C working with Junius Argyle, MD by Valera Castle, ED scribe. This patient was seen in room TR08C/TR08C and the patient's care was started at 4:29 PM.  Chief Complaint  Patient presents with  . Wound Check   The history is provided by medical records and the patient. No language interpreter was used.   HPI Comments: Logan Rodriguez is a 37 y.o. male who presents to the Emergency Department requesting a wound check for a wound to his left thigh, from an I&D here 2 days ago. He states he is wanting his bandages checked and states the wick has been in there for 2 days. He reports the area is still swollen, still draining, and states that he thinks it is getting worse. He states the redness around the area has not changed since his last visit, but he has a new "lump". He reports taking his Bactrim regularly. He states he has been wrapping the area with warm towels, but denies having been applying warm compresses. He reports having a h/o similar abscesses to that area. He denies fever, chills, nausea, emesis, and any other associated symptoms. He denies ever being told he has MRSA. He denies h/o diabetes, HIV or immunosuppressive therapy. He states he has Medicare for insurance.   PCP -Mady Gemma, PA-C  History reviewed. No pertinent past medical history. History reviewed. No pertinent past surgical history. History reviewed. No pertinent family history. History  Substance Use Topics  . Smoking status: Current Some Day Smoker    Types: Cigarettes  . Smokeless tobacco: Not on file  . Alcohol Use: Yes     Comment: occ   Results for orders placed during the hospital encounter of 10/30/13  CBC WITH DIFFERENTIAL      Result Value Range   WBC 8.5  4.0 - 10.5 K/uL   RBC 4.52  4.22 - 5.81 MIL/uL   Hemoglobin 13.3  13.0 - 17.0 g/dL   HCT 40.9 (*)  81.1 - 52.0 %   MCV 85.6  78.0 - 100.0 fL   MCH 29.4  26.0 - 34.0 pg   MCHC 34.4  30.0 - 36.0 g/dL   RDW 91.4  78.2 - 95.6 %   Platelets 270  150 - 400 K/uL   Neutrophils Relative % 75  43 - 77 %   Neutro Abs 6.4  1.7 - 7.7 K/uL   Lymphocytes Relative 18  12 - 46 %   Lymphs Abs 1.5  0.7 - 4.0 K/uL   Monocytes Relative 6  3 - 12 %   Monocytes Absolute 0.5  0.1 - 1.0 K/uL   Eosinophils Relative 1  0 - 5 %   Eosinophils Absolute 0.1  0.0 - 0.7 K/uL   Basophils Relative 0  0 - 1 %   Basophils Absolute 0.0  0.0 - 0.1 K/uL  BASIC METABOLIC PANEL      Result Value Range   Sodium 137  135 - 145 mEq/L   Potassium 3.7  3.5 - 5.1 mEq/L   Chloride 102  96 - 112 mEq/L   CO2 26  19 - 32 mEq/L   Glucose, Bld 91  70 - 99 mg/dL   BUN 11  6 - 23 mg/dL   Creatinine, Ser 2.13  0.50 - 1.35 mg/dL   Calcium 9.4  8.4 - 08.6 mg/dL   GFR calc  non Af Amer >90  >90 mL/min   GFR calc Af Amer >90  >90 mL/min    Review of Systems  Constitutional: Negative for fever and chills.  Gastrointestinal: Negative for nausea, vomiting and abdominal pain.  Skin: Positive for color change and wound (wound to left thigh from ). Negative for rash.  Allergic/Immunologic: Negative for immunocompromised state.  Hematological: Does not bruise/bleed easily.  Psychiatric/Behavioral: The patient is not nervous/anxious.    Allergies  Review of patient's allergies indicates no known allergies.  Home Medications   Current Outpatient Rx  Name  Route  Sig  Dispense  Refill  . HYDROcodone-acetaminophen (NORCO) 5-325 MG per tablet   Oral   Take 1 tablet by mouth every 4 (four) hours as needed.   20 tablet   0   . sulfamethoxazole-trimethoprim (SEPTRA DS) 800-160 MG per tablet   Oral   Take 1 tablet by mouth every 12 (twelve) hours.   10 tablet   0    Triage Vitals: BP 144/76  Pulse 72  Temp(Src) 98.6 F (37 C) (Oral)  Resp 16  Wt 208 lb 6.4 oz (94.53 kg)  SpO2 97%  Physical Exam  Nursing note and vitals  reviewed. Constitutional: He is oriented to person, place, and time. He appears well-developed and well-nourished. No distress.  HENT:  Head: Normocephalic and atraumatic.  Eyes: Conjunctivae are normal. No scleral icterus.  Neck: Normal range of motion.  Cardiovascular: Normal rate, regular rhythm, normal heart sounds and intact distal pulses.   No murmur heard. Pulmonary/Chest: Effort normal and breath sounds normal. No respiratory distress. He has no wheezes.  Abdominal: Soft. Bowel sounds are normal. He exhibits no distension. There is no tenderness.  Genitourinary: Right testis shows no mass, no swelling and no tenderness. Right testis is descended. Cremasteric reflex is not absent on the right side. Left testis shows no mass, no swelling and no tenderness. Cremasteric reflex is not absent on the left side.  No induration of the perineum to suggest Fournier's gangrene  Musculoskeletal:  Full range of motion of the left hip  Lymphadenopathy:    He has no cervical adenopathy.       Right: No inguinal adenopathy present.       Left: Inguinal adenopathy present.  Neurological: He is alert and oriented to person, place, and time.  Skin: Skin is warm and dry. He is not diaphoretic. There is erythema.  Wick in place in left groin incision No abscess just lateral on the left thigh Large area of erythema extending approximately halfway down left thigh with increased warmth and without induration  Psychiatric: He has a normal mood and affect.    ED Course  INCISION AND DRAINAGE Date/Time: 11/01/2013 5:03 PM Performed by: Dierdre Forth Authorized by: Dierdre Forth Consent: Verbal consent obtained. Risks and benefits: risks, benefits and alternatives were discussed Consent given by: patient Patient understanding: patient states understanding of the procedure being performed Patient consent: the patient's understanding of the procedure matches consent given Procedure  consent: procedure consent matches procedure scheduled Relevant documents: relevant documents present and verified Site marked: the operative site was marked Required items: required blood products, implants, devices, and special equipment available Patient identity confirmed: verbally with patient and arm band Time out: Immediately prior to procedure a "time out" was called to verify the correct patient, procedure, equipment, support staff and site/side marked as required. Type: abscess Body area: anogenital (Left groin) Anesthesia: local infiltration Local anesthetic: lidocaine 2% without epinephrine Anesthetic total:  5 ml Patient sedated: no Scalpel size: 11 Incision type: single straight Complexity: complex Drainage: purulent Drainage amount: moderate Wound treatment: wound left open Patient tolerance: Patient tolerated the procedure well with no immediate complications.   (including critical care time)  DIAGNOSTIC STUDIES: Oxygen Saturation is 97% on room air, normal by my interpretation.    COORDINATION OF CARE: 4:37 PM-Discussed treatment plan which includes wound check with pt at bedside and pt agreed to plan. Discussed with pt to f/u if the swelling extends beyond the current drawn line. Advised pt to apply warm compresses to the area.   Labs Review Labs Reviewed - No data to display Imaging Review No results found.  EKG Interpretation   None      No orders of the defined types were placed in this encounter.      MDM   1. Abscess of left groin     Drayce A Lagunes presents for wound check with concerns of worsening infection.  Pt with new abscess on exam.  Packing removed from initial abscess. Patient's 2nd skin abscess amenable to incision and drainage.  Abscess was not large enough to warrant packing or drain,  wound recheck in 2 days here in the emergency department. Patient area of erythema on left thigh marked.  A shunt without history of  immunosuppression, diabetes or HIV. He reports he has been taking his Septra as directed. Encouraged home warm soaks and flushing.  Mild signs of cellulitis is surrounding skin.  Patient afebrile, non-tachycardic without evidence of Sirs or sepsis.  Photo taken of cellulitic area, see below for comparison.  Patient was discussed with and evaluated by Dr. Romeo Apple who agrees with the plan to discharge with 2 days followup.  It has been determined that no acute conditions requiring further emergency intervention are present at this time. The patient/guardian have been advised of the diagnosis and plan. We have discussed signs and symptoms that warrant return to the ED, such as changes or worsening in symptoms.   Vital signs are stable at discharge.   BP 144/76  Pulse 72  Temp(Src) 98.6 F (37 C) (Oral)  Resp 16  Wt 208 lb 6.4 oz (94.53 kg)  SpO2 97%  Patient/guardian has voiced understanding and agreed to follow-up with the PCP or specialist.    I personally performed the services described in this documentation, which was scribed in my presence. The recorded information has been reviewed and is accurate.       p    Dierdre Forth, PA-C 11/01/13 1704

## 2013-11-01 NOTE — ED Provider Notes (Signed)
Medical screening examination/treatment/procedure(s) were conducted as a shared visit with non-physician practitioner(s) and myself.  I personally evaluated the patient during the encounter.  EKG Interpretation   None       I interviewed and examined the patient. Lungs are CTAB. Cardiac exam wnl. Abdomen soft.  Mild spreading redness noted. Pt has only been on bactrim x 2 days. Is unable to afford clinda. Will rec continued bactrim and repeat wound check here in 2 days w/ return precautions to come back sooner for fever, spreading redness, worsening pain. A picture is documented for future reference.   Junius Argyle, MD 11/01/13 2023

## 2013-11-01 NOTE — ED Notes (Signed)
Pt c/o wound check to left thigh where had wound I/D 2 days ago; pt sts some continued swelling in another area; pt sts taking antibiotics

## 2013-11-03 ENCOUNTER — Emergency Department (HOSPITAL_COMMUNITY)
Admission: EM | Admit: 2013-11-03 | Discharge: 2013-11-03 | Disposition: A | Discharge status: Home / Self Care | Payer: Medicare Other | Attending: Emergency Medicine | Admitting: Emergency Medicine

## 2013-11-03 DIAGNOSIS — Z4801 Encounter for change or removal of surgical wound dressing: Secondary | ICD-10-CM | POA: Insufficient documentation

## 2013-11-03 DIAGNOSIS — F17200 Nicotine dependence, unspecified, uncomplicated: Secondary | ICD-10-CM | POA: Insufficient documentation

## 2013-11-03 DIAGNOSIS — L0291 Cutaneous abscess, unspecified: Secondary | ICD-10-CM

## 2013-11-03 MED ORDER — SULFAMETHOXAZOLE-TRIMETHOPRIM 800-160 MG PO TABS: 1.0000 | ORAL_TABLET | Freq: Two times a day (BID) | ORAL | Status: DC

## 2013-11-03 NOTE — ED Provider Notes (Signed)
CSN: [id number]     Arrival date & time 11/03/13  1908 History   First MD Initiated Contact with Patient 11/03/13 2043     Chief Complaint  Patient presents with  . wound check    (Consider location/radiation/quality/duration/timing/severity/associated sxs/prior Treatment) HPI Patient with I&D to left groin abscess 4 days ago and left anterior thigh abscess 2 days ago presents for recheck.  States slight amount of bloody discharge. He is taking bactrim DS 1 tab PO BID and states that the areas are improving.  States the redness has improved.  Denies fevers, chills, body aches, N/V, testicular pain, scrotal swelling, abdominal pain. Denies pain over the areas.    No past medical history on file. No past surgical history on file. No family history on file. History  Substance Use Topics  . Smoking status: Current Some Day Smoker    Types: Cigarettes  . Smokeless tobacco: Not on file  . Alcohol Use: Yes     Comment: occ    Review of Systems  Constitutional: Negative for fever and chills.  Gastrointestinal: Negative for nausea, vomiting and abdominal pain.  Genitourinary: Negative for scrotal swelling and testicular pain.  Skin: Positive for color change and wound.    Allergies  Review of patient's allergies indicates no known allergies.  Home Medications   Current Outpatient Rx  Name  Route  Sig  Dispense  Refill  . HYDROcodone-acetaminophen (NORCO/VICODIN) 5-325 MG per tablet   Oral   Take 1 tablet by mouth every 4 (four) hours as needed for moderate pain.         Marland Kitchen sulfamethoxazole-trimethoprim (BACTRIM DS) 800-160 MG per tablet   Oral   Take 1 tablet by mouth 2 (two) times daily. Takes for 5 days.  First dose taken 10/30/2014.          BP 136/73  Pulse 75  Temp(Src) 97.8 F (36.6 C) (Oral)  Resp 16  Wt 212 lb 7 oz (96.361 kg)  SpO2 100% Physical Exam  Nursing note and vitals reviewed. Constitutional: He appears well-developed and well-nourished. No distress.   HENT:  Head: Normocephalic and atraumatic.  Neck: Neck supple.  Pulmonary/Chest: Effort normal.  Abdominal: Soft. He exhibits no distension. There is no tenderness. There is no rebound and no guarding.  Genitourinary:     Neurological: He is alert.  Skin: He is not diaphoretic.    ED Course  Procedures (including critical care time) Labs Review Labs Reviewed - No data to display Imaging Review No results found.  EKG Interpretation   None       Nurse to draw new line around erythema.   MDM   1. Cellulitis and abscess    Patient with recent I&Ds of left groin and left anterior thigh.  All areas are improving.  Pt denies any current pain or any systemic symptoms.  Pt continues to have erythema surrounding the thigh abscess, c/w cellulitis.  Will extend his current regimen of antibiotics.  PCP follow up.  Discussed  findings, treatment, and follow up  with patient.  Pt given return precautions.  Pt verbalizes understanding and agrees with plan.        Trixie Dredge, PA-C 11/03/13 2056

## 2013-11-03 NOTE — ED Notes (Signed)
The pt  Is here for a wound check?? He does not want  To talk

## 2013-11-03 NOTE — ED Notes (Signed)
2 sites of left groin inc that was done to drain abcess, was told to have it seen today by the doctor, no pain at site, no reddness or swelling, pt states he needs more antibiotic pills

## 2013-11-03 NOTE — ED Provider Notes (Signed)
  Medical screening examination/treatment/procedure(s) were performed by non-physician practitioner and as supervising physician I was immediately available for consultation/collaboration.  EKG Interpretation   None          Shamecca Whitebread, MD 11/03/13 2354 

## 2013-11-07 ENCOUNTER — Encounter (HOSPITAL_BASED_OUTPATIENT_CLINIC_OR_DEPARTMENT_OTHER): Payer: Medicare Other

## 2014-01-04 ENCOUNTER — Encounter (HOSPITAL_COMMUNITY): Payer: Self-pay | Admitting: Emergency Medicine

## 2014-01-04 ENCOUNTER — Emergency Department (HOSPITAL_COMMUNITY)
Admission: EM | Admit: 2014-01-04 | Discharge: 2014-01-04 | Disposition: A | Discharge status: Home / Self Care | Payer: Medicare Other | Attending: Emergency Medicine | Admitting: Emergency Medicine

## 2014-01-04 DIAGNOSIS — L03319 Cellulitis of trunk, unspecified: Principal | ICD-10-CM

## 2014-01-04 DIAGNOSIS — F17200 Nicotine dependence, unspecified, uncomplicated: Secondary | ICD-10-CM | POA: Insufficient documentation

## 2014-01-04 DIAGNOSIS — L02219 Cutaneous abscess of trunk, unspecified: Secondary | ICD-10-CM | POA: Insufficient documentation

## 2014-01-04 DIAGNOSIS — L02211 Cutaneous abscess of abdominal wall: Secondary | ICD-10-CM

## 2014-01-04 MED ORDER — CLINDAMYCIN HCL 150 MG PO CAPS: 300.0000 mg | ORAL_CAPSULE | Freq: Three times a day (TID) | ORAL | Status: DC

## 2014-01-04 MED ORDER — HYDROCODONE-ACETAMINOPHEN 5-325 MG PO TABS: 2.0000 | ORAL_TABLET | ORAL | Status: DC | PRN

## 2014-01-04 MED ORDER — CLINDAMYCIN HCL 150 MG PO CAPS
150.0000 mg | ORAL_CAPSULE | Freq: Once | ORAL | Status: AC
  Administered 2014-01-04: 150 mg via ORAL
  Filled 2014-01-04 (×2): qty 1

## 2014-01-04 MED ORDER — NAPROXEN 500 MG PO TABS: 500.0000 mg | ORAL_TABLET | Freq: Two times a day (BID) | ORAL | Status: DC

## 2014-01-04 NOTE — ED Notes (Addendum)
Pt reports swelling and abscess type area to belly button. Reports that this started approx 1 month ago after starting antibiotics.  Area is red and drainage present. Pt reports pain to area.

## 2014-01-04 NOTE — Discharge Instructions (Signed)
Please call your doctor for a followup appointment within 24-48 hours. When you talk to your doctor please let them know that you were seen in the emergency department and have them acquire all of your records so that they can discuss the findings with you and formulate a treatment plan to fully care for your new and ongoing problems. ° °

## 2014-01-04 NOTE — ED Provider Notes (Signed)
CSN: [id number]     Arrival date & time 01/04/14  0254 History   First MD Initiated Contact with Patient 01/04/14 0356     Chief Complaint  Patient presents with  . Abdominal Pain   (Consider location/radiation/quality/duration/timing/severity/associated sxs/prior Treatment) HPI Comments: 38 year old male, history of occasional skin infections. Presents after having onset of lower abdominal skin infection. This started in the last 2 days, has gradually worsened, is now associated with tenderness in the periumbilical region, denies fevers chills nausea or vomiting. He has known allergies to doxycycline however he had some of this medication left over from a prior infection, he tried to take it, it has not improved his symptoms. The infection is gradually worsening, the redness is gradually spreading onto his abdominal wall. He denies diabetes, denies HIV, denies other immunocompromise state or use of prednisone.  Patient is a 38 y.o. male presenting with abdominal pain. The history is provided by the patient.  Abdominal Pain   History reviewed. No pertinent past medical history. History reviewed. No pertinent past surgical history. No family history on file. History  Substance Use Topics  . Smoking status: Current Some Day Smoker    Types: Cigarettes  . Smokeless tobacco: Not on file  . Alcohol Use: Yes     Comment: occ    Review of Systems  Gastrointestinal: Positive for abdominal pain.  All other systems reviewed and are negative.    Allergies  Doxycycline and Septra  Home Medications   Current Outpatient Rx  Name  Route  Sig  Dispense  Refill  . HYDROcodone-acetaminophen (NORCO/VICODIN) 5-325 MG per tablet   Oral   Take 1 tablet by mouth every 4 (four) hours as needed for moderate pain.         . clindamycin (CLEOCIN) 150 MG capsule   Oral   Take 2 capsules (300 mg total) by mouth 3 (three) times daily. May dispense as 150mg  capsules   60 capsule   0   .  HYDROcodone-acetaminophen (NORCO/VICODIN) 5-325 MG per tablet   Oral   Take 2 tablets by mouth every 4 (four) hours as needed.   10 tablet   0   . naproxen (NAPROSYN) 500 MG tablet   Oral   Take 1 tablet (500 mg total) by mouth 2 (two) times daily with a meal.   30 tablet   0    BP 134/75  Pulse 70  Temp(Src) 99 F (37.2 C) (Oral)  Resp 16  Ht 5\' 8"  (1.727 m)  Wt 205 lb (92.987 kg)  BMI 31.18 kg/m2  SpO2 99% Physical Exam  Nursing note and vitals reviewed. Constitutional: He appears well-developed and well-nourished. No distress.  HENT:  Head: Normocephalic and atraumatic.  Eyes: Conjunctivae are normal. Right eye exhibits no discharge. Left eye exhibits no discharge. No scleral icterus.  Cardiovascular: Normal rate, regular rhythm, normal heart sounds and intact distal pulses.  Exam reveals no gallop and no friction rub.   No murmur heard. Pulmonary/Chest: Effort normal and breath sounds normal. No respiratory distress. He has no wheezes. He has no rales.  Abdominal: Soft. Bowel sounds are normal. He exhibits no distension and no mass. There is tenderness.  Small area of fluctuance in the umbilicus, redness of the skin extending out in all directions in a circumferential pattern approximately 4 cm in each correction. Minimal induration surrounding the abscess  Musculoskeletal: Normal range of motion. He exhibits no edema and no tenderness.  Neurological: He is alert. Coordination normal.  Skin:  Skin is warm and dry. No rash noted. No erythema.  Psychiatric: He has a normal mood and affect. His behavior is normal.    ED Course  Procedures (including critical care time) Labs Review Labs Reviewed - No data to display Imaging Review No results found.  EKG Interpretation   None       MDM   1. Abscess of abdominal wall    Ultrasound performed at the bedside by myself shows a collection of fluid under the area of fluctuance, will need incision and drainage, because  he has doxycycline and sulfa allergies will treat with clindamycin. He otherwise appears nontoxic, no fever, no tachycardia.  INCISION AND DRAINAGE Performed by: Eber HongMILLER,Piera Downs D Consent: Verbal consent obtained. Risks and benefits: risks, benefits and alternatives were discussed Type: abscess  Body area: Umbilicus  Anesthesia: local infiltration  Incision was made with a scalpel.  Local anesthetic: lidocaine 1 % without epinephrine  Anesthetic total: 2 ml  Complexity: complex Blunt dissection to break up loculations  Drainage: purulent  Drainage amount: Moderate   Packing material: None   Patient tolerance: Patient tolerated the procedure well with no immediate complications.   The patient tolerated the incision and drainage, pain medicine given, local anesthesia tolerated, purulent material completely removed from the wound, irrigated, no packing placed, patient given instructions on when to return, will followup in 2 days for recheck, start clindamycin.   Meds given in ED:  Medications  clindamycin (CLEOCIN) capsule 150 mg (not administered)    New Prescriptions   CLINDAMYCIN (CLEOCIN) 150 MG CAPSULE    Take 2 capsules (300 mg total) by mouth 3 (three) times daily. May dispense as 150mg  capsules   HYDROCODONE-ACETAMINOPHEN (NORCO/VICODIN) 5-325 MG PER TABLET    Take 2 tablets by mouth every 4 (four) hours as needed.   NAPROXEN (NAPROSYN) 500 MG TABLET    Take 1 tablet (500 mg total) by mouth 2 (two) times daily with a meal.      Vida RollerBrian D Latiya Navia, MD 01/04/14 [id number]

## 2014-02-09 ENCOUNTER — Ambulatory Visit (INDEPENDENT_AMBULATORY_CARE_PROVIDER_SITE_OTHER): Payer: Medicare Other | Admitting: Internal Medicine

## 2014-02-09 ENCOUNTER — Encounter: Payer: Self-pay | Admitting: Internal Medicine

## 2014-02-09 VITALS — BP 163/92 | HR 86 | Temp 98.2°F | Ht 68.0 in | Wt 206.0 lb

## 2014-02-09 DIAGNOSIS — L02229 Furuncle of trunk, unspecified: Principal | ICD-10-CM

## 2014-02-09 DIAGNOSIS — L02239 Carbuncle of trunk, unspecified: Secondary | ICD-10-CM

## 2014-02-09 NOTE — Progress Notes (Signed)
Rvf: referred for recurrent skin infection/carbuncle Subjective:    Patient ID: Esaw DaceBradford A Meloy, male    DOB: 1976-12-05, 38 y.o.   MRN: [id number]  HPI Malvin JohnsBradford is a 38yo M with recurrent deep tissue infection, carbuncle noted on left axilla s/p I x D by pcp healing well. Did not take any antibiotics for this latest occurrence. He states he has these lesions infrequently, however PCP suggests that this is his 3rd occurrence within 12 months. He has had abtx prescribed in the past which were associated with developing a rash localized to his torso. He is unclear which of doxy or bactrim caused his rash since he was changed mid-course where the rash/swelling of umbilicus persisted. He was referred to the ID clinic for further evaluation. He states that they mainly occur in axillary region sometimes in groin area, but no other lesions are active currently.  Current Outpatient Prescriptions on File Prior to Visit  Medication Sig Dispense Refill  . HYDROcodone-acetaminophen (NORCO/VICODIN) 5-325 MG per tablet Take 2 tablets by mouth every 4 (four) hours as needed.  10 tablet  0   No current facility-administered medications on file prior to visit.   Active Ambulatory Problems    Diagnosis Date Noted  . No Active Ambulatory Problems   Resolved Ambulatory Problems    Diagnosis Date Noted  . No Resolved Ambulatory Problems   No Additional Past Medical History   History  Substance Use Topics  . Smoking status: Former Smoker    Types: Cigarettes  . Smokeless tobacco: Not on file  . Alcohol Use: Yes     Comment: occ  family history is not on file.    Review of Systems  Constitutional: Negative for fever, chills, diaphoresis, activity change, appetite change, fatigue and unexpected weight change.  HENT: Negative for congestion, sore throat, rhinorrhea, sneezing, trouble swallowing and sinus pressure.  Eyes: Negative for photophobia and visual disturbance.  Respiratory: Negative for  cough, chest tightness, shortness of breath, wheezing and stridor.  Cardiovascular: Negative for chest pain, palpitations and leg swelling.  Gastrointestinal: Negative for nausea, vomiting, abdominal pain, diarrhea, constipation, blood in stool, abdominal distention and anal bleeding.  Genitourinary: Negative for dysuria, hematuria, flank pain and difficulty urinating.  Musculoskeletal: Negative for myalgias, back pain, joint swelling, arthralgias and gait problem.  Skin: healing left axillayr lesion. Non draining non painful Neurological: Negative for dizziness, tremors, weakness and light-headedness.  Hematological: Negative for adenopathy. Does not bruise/bleed easily.  Psychiatric/Behavioral: Negative for behavioral problems, confusion, sleep disturbance, dysphoric mood, decreased concentration and agitation.       Objective:   Physical Exam BP 163/92  Pulse 86  Temp(Src) 98.2 F (36.8 C) (Oral)  Ht 5\' 8"  (1.727 m)  Wt 206 lb (93.441 kg)  BMI 31.33 kg/m2 Physical Exam  Constitutional: He is oriented to person, place, and time. He appears well-developed and well-nourished. No distress.  HENT:  Mouth/Throat: Oropharynx is clear and moist. No oropharyngeal exudate.  Cardiovascular: Normal rate, regular rhythm and normal heart sounds. Exam reveals no gallop and no friction rub.  No murmur heard.  Pulmonary/Chest: Effort normal and breath sounds normal. No respiratory distress. He has no wheezes.  Abdominal: Soft. Bowel sounds are normal. He exhibits no distension. There is no tenderness.  Lymphadenopathy:  He has no cervical adenopathy.  Neurological: He is alert and oriented to person, place, and time.  Skin: Skin is warm and dry. He has healing incision on left axilla at site of prior carbuncle, non draining no  fluctuance, no erythema or induration Psychiatric: He has a normal mood and affect. His behavior is normal.        Assessment & Plan:   carbuncle =appears healing.  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 Did not take clindamycin this time. Still would be viable option to take for further outbreak. If he has more frequent outbreaks. Would recommend to undergo mrsa decolonization with doing mupirocin ointment in anterior nares TID x 10 days along with chlorhexadine bath daily in addition to 10 day course of antibiotics, usually prescribe bactrim but would substitute out for clindamycin given his drug allergies. Presently it does not appear significant enough to consider decolonizing unless he was worsening disease. I also wonder if he may have early stages of hydradadinitis suppurtiva where it is predominantly localized to axillary and groin areas.  Recommended to avoid using antiperspirants to underarms until his current lesion improves.  If he has further lesions, would send specimen for collection from next I X D to see that he is MRSA colonized as we suspect.  Duke Salvia Drue Second MD MPH [facility] [phone]   rtc prn

## 2014-02-27 ENCOUNTER — Emergency Department (HOSPITAL_COMMUNITY)
Admission: EM | Admit: 2014-02-27 | Discharge: 2014-02-27 | Disposition: A | Discharge status: Home / Self Care | Payer: Medicare Other | Attending: Emergency Medicine | Admitting: Emergency Medicine

## 2014-02-27 ENCOUNTER — Encounter (HOSPITAL_COMMUNITY): Payer: Self-pay | Admitting: Emergency Medicine

## 2014-02-27 DIAGNOSIS — L02229 Furuncle of trunk, unspecified: Principal | ICD-10-CM | POA: Insufficient documentation

## 2014-02-27 DIAGNOSIS — N508 Other specified disorders of male genital organs: Secondary | ICD-10-CM | POA: Insufficient documentation

## 2014-02-27 DIAGNOSIS — L905 Scar conditions and fibrosis of skin: Secondary | ICD-10-CM

## 2014-02-27 DIAGNOSIS — Z87891 Personal history of nicotine dependence: Secondary | ICD-10-CM | POA: Insufficient documentation

## 2014-02-27 DIAGNOSIS — L0292 Furuncle, unspecified: Secondary | ICD-10-CM

## 2014-02-27 DIAGNOSIS — L02239 Carbuncle of trunk, unspecified: Secondary | ICD-10-CM | POA: Insufficient documentation

## 2014-02-27 HISTORY — DX: Cutaneous abscess, unspecified: L02.91

## 2014-02-27 NOTE — Discharge Instructions (Signed)
Abscess An abscess is an infected area that contains a collection of pus and debris.It can occur in almost any part of the body. An abscess is also known as a furuncle or boil. CAUSES  An abscess occurs when tissue gets infected. This can occur from blockage of oil or sweat glands, infection of hair follicles, or a minor injury to the skin. As the body tries to fight the infection, pus collects in the area and creates pressure under the skin. This pressure causes pain. People with weakened immune systems have difficulty fighting infections and get certain abscesses more often.  SYMPTOMS Usually an abscess develops on the skin and becomes a painful mass that is red, warm, and tender. If the abscess forms under the skin, you may feel a moveable soft area under the skin. Some abscesses break open (rupture) on their own, but most will continue to get worse without care. The infection can spread deeper into the body and eventually into the bloodstream, causing you to feel ill.  DIAGNOSIS  Your caregiver will take your medical history and perform a physical exam. A sample of fluid may also be taken from the abscess to determine what is causing your infection. TREATMENT  Your caregiver may prescribe antibiotic medicines to fight the infection. However, taking antibiotics alone usually does not cure an abscess. Your caregiver may need to make a small cut (incision) in the abscess to drain the pus. In some cases, gauze is packed into the abscess to reduce pain and to continue draining the area. HOME CARE INSTRUCTIONS   Only take over-the-counter or prescription medicines for pain, discomfort, or fever as directed by your caregiver.  If you were prescribed antibiotics, take them as directed. Finish them even if you start to feel better.  If gauze is used, follow your caregiver's directions for changing the gauze.  To avoid spreading the infection:  Keep your draining abscess covered with a  bandage.  Wash your hands well.  Do not share personal care items, towels, or whirlpools with others.  Avoid skin contact with others.  Keep your skin and clothes clean around the abscess.  Keep all follow-up appointments as directed by your caregiver. SEEK MEDICAL CARE IF:   You have increased pain, swelling, redness, fluid drainage, or bleeding.  You have muscle aches, chills, or a general ill feeling.  You have a fever. MAKE SURE YOU:   Understand these instructions.  Will watch your condition.  Will get help right away if you are not doing well or get worse. Document Released: 09/16/2005 Document Revised: 06/07/2012 Document Reviewed: 02/19/2012 [facility] Patient Information 2014 [place], [place]. Acne Acne is a skin problem that causes pimples. Acne occurs when the pores in your skin get blocked. Your pores may become red, sore, and swollen (inflamed), or infected with a common skin bacterium (Propionibacterium acnes). Acne is a common skin problem. Up to 80% of people get acne at some time. Acne is especially common from the ages of 62 to 45. Acne usually goes away over time with proper treatment. CAUSES  Your pores each contain an oil gland. The oil glands make an oily substance called sebum. Acne happens when these glands get plugged with sebum, dead skin cells, and dirt. The P. acnes bacteria that are normally found in the oil glands then multiply, causing inflammation. Acne is commonly triggered by changes in your hormones. These hormonal changes can cause the oil glands to get bigger and to make more sebum. Factors that can  make acne worse include:  Hormone changes during adolescence.  Hormone changes during women's menstrual cycles.  Hormone changes during pregnancy.  Oil-based cosmetics and hair products.  Harshly scrubbing the skin.  Strong soaps.  Stress.  Hormone problems due to certain diseases.  Long or oily hair rubbing against the skin.  Certain  medicines.  Pressure from headbands, backpacks, or shoulder pads.  Exposure to certain oils and chemicals. SYMPTOMS  Acne often occurs on the face, neck, chest, and upper back. Symptoms include:  Small, red bumps (pimples or papules).  Whiteheads (closed comedones).  Blackheads (open comedones).  Small, pus-filled pimples (pustules).  Big, red pimples or pustules that feel tender. More severe acne can cause:  An infected area that contains a collection of pus (abscess).  Hard, painful, fluid-filled sacs (cysts).  Scars. DIAGNOSIS  Your caregiver can usually tell what the problem is by doing a physical exam. TREATMENT  There are many good treatments for acne. Some are available over-the-counter and some are available with a prescription. The treatment that is best for you depends on the type of acne you have and how severe it is. It may take 2 months of treatment before your acne gets better. Common treatments include:  Creams and lotions that prevent oil glands from clogging.  Creams and lotions that treat or prevent infections and inflammation.  Antibiotics applied to the skin or taken as a pill.  Pills that decrease sebum production.  Birth control pills.  Light or laser treatments.  Minor surgery.  Injections of medicine into the affected areas.  Chemicals that cause peeling of the skin. HOME CARE INSTRUCTIONS  Good skin care is the most important part of treatment.  Wash your skin gently at least twice a day and after exercise. Always wash your skin before bed.  Use mild soap.  After each wash, apply a water-based skin moisturizer.  Keep your hair clean and off of your face. Shampoo your hair daily.  Only take medicines as directed by your caregiver.  Use a sunscreen or sunblock with SPF 30 or greater. This is especially important when you are using acne medicines.  Choose cosmetics that are noncomedogenic. This means they do not plug the oil  glands.  Avoid leaning your chin or forehead on your hands.  Avoid wearing tight headbands or hats.  Avoid picking or squeezing your pimples. This can make your acne worse and cause scarring. SEEK MEDICAL CARE IF:   Your acne is not better after 8 weeks.  Your acne gets worse.  You have a large area of skin that is red or tender. Document Released: 12/04/2000 Document Revised: 02/29/2012 Document Reviewed: 09/25/2011 [facility] Patient Information 2014 [place]

## 2014-02-27 NOTE — ED Provider Notes (Signed)
Medical screening examination/treatment/procedure(s) were performed by non-physician practitioner and as supervising physician I was immediately available for consultation/collaboration.  Flint MelterElliott L Phyllicia Dudek, MD 02/27/14 [phone]

## 2014-02-27 NOTE — ED Provider Notes (Signed)
CSN: [id number]     Arrival date & time 02/27/14  0756 History   First MD Initiated Contact with Patient 02/27/14 0818     Chief Complaint  Patient presents with  . Abscess     (Consider location/radiation/quality/duration/timing/severity/associated sxs/prior Treatment) HPI Logan Rodriguez 38 year old male with a past medical history of abscess.  The patient has had multiple abscesses in the axillary and groin area.  He has been seen here for symptoms previously.  Patient complains of draining abscess on the left upper thigh near the groin.  He is also concerned with areas of induration in the bilateral groin area which had been previously incised.  Patient also has a soft nodule on the bottom of his right testicle for which he was seeking evaluation.  Patient denies any fevers, chills, myalgias.   Past Medical History  Diagnosis Date  . Abscess    History reviewed. No pertinent past surgical history. No family history on file. History  Substance Use Topics  . Smoking status: Former Smoker    Types: Cigarettes  . Smokeless tobacco: Not on file  . Alcohol Use: Yes     Comment: occ    Review of Systems  Constitutional: Negative for fever and chills.  Genitourinary: Negative for dysuria, discharge, scrotal swelling and penile pain.  Musculoskeletal: Negative for myalgias.  Skin: Positive for wound.  Hematological: Negative for adenopathy.      Allergies  Doxycycline and Septra  Home Medications   Current Outpatient Rx  Name  Route  Sig  Dispense  Refill  . HYDROcodone-acetaminophen (NORCO/VICODIN) 5-325 MG per tablet   Oral   Take 2 tablets by mouth every 4 (four) hours as needed.   10 tablet   0    BP 145/89  Pulse 86  Temp(Src) 97.6 F (36.4 C) (Oral)  Resp 18  Wt 210 lb (95.255 kg)  SpO2 99% Physical Exam  Nursing note and vitals reviewed. Constitutional: He appears well-developed and well-nourished. No distress.  HENT:  Head: Normocephalic and  atraumatic.  Eyes: Conjunctivae are normal. No scleral icterus.  Neck: Normal range of motion. Neck supple.  Cardiovascular: Normal rate, regular rhythm and normal heart sounds.   Pulmonary/Chest: Effort normal and breath sounds normal. No respiratory distress.  Abdominal: Soft. There is no tenderness.  Genitourinary:    Right testis shows mass. Circumcised. Penile tenderness present.  Draining Abscess  left upper groin.  Serous sanguinous.  No purulence expressed.  There is mild surrounding induration.  2 areas of induration and scar tissue palpable in the upper groin area as noted in the picture.  They are approximately 4 x 3 cm both with central well-healed incision lines.  Right testicle with small soft movable mass superficial in the scrotal tissue.  This appears consistent with epidermal cyst.  Central poor.  No signs of infection.  Normal penis, circumcised, no discharge, no testicle pain.  No lymphadenopathy.  Musculoskeletal: He exhibits no edema.  Neurological: He is alert.  Skin: Skin is warm and dry. He is not diaphoretic.  Psychiatric: His behavior is normal.    ED Course  Procedures (including critical care time) Labs Review Labs Reviewed - No data to display Imaging Review No results found.   EKG Interpretation None      MDM   Final diagnoses:  Boil  Scar tissue    9:24 AM BP 132/90  Pulse 76  Temp(Src) 97.7 F (36.5 C) (Oral)  Resp 18  Wt 210 lb (95.255 kg)  SpO2 100% Patient with history of multiple abscesses to use in the groin and axilla.  Current abscesses opened and draining without purulent discharge.  Do not feel he needs incision at this time.  DD use a bedside ultrasound to evaluate the areas questioned.  Areas in the groin that are indurated appear consistent with scar tissue.  No drainable abscess noted underneath the current abscess which is okay.  Do not feel patient needs antibiotics at this time.  Patient bandaged and cleaned and discharged  to followup with his primary care physician and have also given him a dermatology referral.     Arthor Captain, PA-C 02/27/14 [phone]

## 2014-02-27 NOTE — ED Notes (Signed)
Pt c/o abscess to left groin x 2 weeks -states is draining bloody drainage. Also c/o 2 more areas that are swollen and tender when palpate or move.

## 2014-03-05 ENCOUNTER — Other Ambulatory Visit: Payer: Self-pay | Admitting: Family Medicine

## 2014-03-05 DIAGNOSIS — N5089 Other specified disorders of the male genital organs: Secondary | ICD-10-CM

## 2014-03-07 ENCOUNTER — Ambulatory Visit
Admission: RE | Admit: 2014-03-07 | Discharge: 2014-03-07 | Disposition: A | Discharge status: Home / Self Care | Payer: Medicare Other | Source: Ambulatory Visit | Attending: Family Medicine | Admitting: Family Medicine

## 2014-03-07 DIAGNOSIS — N5089 Other specified disorders of the male genital organs: Secondary | ICD-10-CM

## 2014-03-11 ENCOUNTER — Encounter (HOSPITAL_COMMUNITY): Payer: Self-pay | Admitting: Emergency Medicine

## 2014-03-11 ENCOUNTER — Emergency Department (HOSPITAL_COMMUNITY)
Admission: EM | Admit: 2014-03-11 | Discharge: 2014-03-11 | Disposition: A | Discharge status: Home / Self Care | Payer: Medicare Other | Attending: Emergency Medicine | Admitting: Emergency Medicine

## 2014-03-11 DIAGNOSIS — L03319 Cellulitis of trunk, unspecified: Secondary | ICD-10-CM

## 2014-03-11 DIAGNOSIS — L02219 Cutaneous abscess of trunk, unspecified: Secondary | ICD-10-CM | POA: Insufficient documentation

## 2014-03-11 DIAGNOSIS — Z87891 Personal history of nicotine dependence: Secondary | ICD-10-CM | POA: Insufficient documentation

## 2014-03-11 DIAGNOSIS — L0291 Cutaneous abscess, unspecified: Secondary | ICD-10-CM

## 2014-03-11 DIAGNOSIS — IMO0002 Reserved for concepts with insufficient information to code with codable children: Secondary | ICD-10-CM | POA: Insufficient documentation

## 2014-03-11 MED ORDER — OXYCODONE-ACETAMINOPHEN 5-325 MG PO TABS
1.0000 | ORAL_TABLET | ORAL | Status: DC | PRN
Start: 2014-03-11 — End: 2014-03-21

## 2014-03-11 NOTE — Discharge Instructions (Signed)
Abscess An abscess is an infected area that contains a collection of pus and debris.It can occur in almost any part of the body. An abscess is also known as a furuncle or boil. CAUSES  An abscess occurs when tissue gets infected. This can occur from blockage of oil or sweat glands, infection of hair follicles, or a minor injury to the skin. As the body tries to fight the infection, pus collects in the area and creates pressure under the skin. This pressure causes pain. People with weakened immune systems have difficulty fighting infections and get certain abscesses more often.  SYMPTOMS Usually an abscess develops on the skin and becomes a painful mass that is red, warm, and tender. If the abscess forms under the skin, you may feel a moveable soft area under the skin. Some abscesses break open (rupture) on their own, but most will continue to get worse without care. The infection can spread deeper into the body and eventually into the bloodstream, causing you to feel ill.  DIAGNOSIS  Your caregiver will take your medical history and perform a physical exam. A sample of fluid may also be taken from the abscess to determine what is causing your infection. TREATMENT  Your caregiver may prescribe antibiotic medicines to fight the infection. However, taking antibiotics alone usually does not cure an abscess. Your caregiver may need to make a small cut (incision) in the abscess to drain the pus. In some cases, gauze is packed into the abscess to reduce pain and to continue draining the area. HOME CARE INSTRUCTIONS   Only take over-the-counter or prescription medicines for pain, discomfort, or fever as directed by your caregiver.  If you were prescribed antibiotics, take them as directed. Finish them even if you start to feel better.  If gauze is used, follow your caregiver's directions for changing the gauze.  To avoid spreading the infection:  Keep your draining abscess covered with a  bandage.  Wash your hands well.  Do not share personal care items, towels, or whirlpools with others.  Avoid skin contact with others.  Keep your skin and clothes clean around the abscess.  Keep all follow-up appointments as directed by your caregiver. SEEK MEDICAL CARE IF:   You have increased pain, swelling, redness, fluid drainage, or bleeding.  You have muscle aches, chills, or a general ill feeling.  You have a fever. MAKE SURE YOU:   Understand these instructions.  Will watch your condition.  Will get help right away if you are not doing well or get worse. Document Released: 09/16/2005 Document Revised: 06/07/2012 Document Reviewed: 02/19/2012 [employer] Patient Information 2014 [employer].  

## 2014-03-11 NOTE — ED Provider Notes (Signed)
CSN: [id number]     Arrival date & time 03/11/14  [id number] History   None    Chief Complaint  Patient presents with  . Abscess     (Consider location/radiation/quality/duration/timing/severity/associated sxs/prior Treatment) Patient is a 38 y.o. male presenting with abscess. The history is provided by the patient. No language interpreter was used.  Abscess Associated symptoms: no fatigue, no fever, no nausea and no vomiting   This is a 37yo AAM w/ PMH multiple abscesses who presents to ED w/ c/o painful nodules to L arm pit and L groin. Patient notes that the L arm nodule first started as a small bump last week and has since become more swollen and painful. He had this evaluated in ED on 3/10 at which point the area was not thought to require I&D. Patient has h/o multiple prior abscess to L groin and he notes some increased pain and swelling to this area over the last few days. He is followed by ID, Dr. Drue SecondSnider, who saw patient on 2/20 and recommended possible decolonization if patient continues to have frequent abscesses. She also queried if patient may have early stage of hydradenitis suppurativa given the recurrent abscesses to areas w/ high concentration of sweat glands.  Of note, he is allergic to doxycycline and septra.   Past Medical History  Diagnosis Date  . Abscess    History reviewed. No pertinent past surgical history. No family history on file. History  Substance Use Topics  . Smoking status: Former Smoker    Types: Cigarettes  . Smokeless tobacco: Not on file  . Alcohol Use: Yes     Comment: occ    Review of Systems  Constitutional: Negative for fever, chills and fatigue.  Respiratory: Negative for shortness of breath.   Cardiovascular: Negative for chest pain and leg swelling.  Gastrointestinal: Negative for nausea, vomiting and abdominal pain.  Genitourinary: Negative for dysuria.  Skin: Positive for color change.       Painful nodule to L armpit and L groin    Neurological: Negative for light-headedness.  All other systems reviewed and are negative.      Allergies  Doxycycline and Septra  Home Medications   Current Outpatient Rx  Name  Route  Sig  Dispense  Refill  . ibuprofen (ADVIL,MOTRIN) 200 MG tablet   Oral   Take 400 mg by mouth every 6 (six) hours as needed for headache or moderate pain.         Marland Kitchen. ibuprofen (ADVIL,MOTRIN) 800 MG tablet   Oral   Take 800 mg by mouth every 8 (eight) hours as needed for headache or moderate pain.         Marland Kitchen. oxyCODONE-acetaminophen (PERCOCET/ROXICET) 5-325 MG per tablet   Oral   Take 1 tablet by mouth every 4 (four) hours as needed for moderate pain or severe pain.          BP 139/73  Pulse 71  Temp(Src) 97.6 F (36.4 C) (Oral)  Resp 22  SpO2 97% Physical Exam  Nursing note and vitals reviewed. Constitutional: He is oriented to person, place, and time. He appears well-developed and well-nourished. No distress.  HENT:  Head: Normocephalic and atraumatic.  Mouth/Throat: Oropharynx is clear and moist.  Eyes:  Vision grossly intact  Neck: Neck supple.  Cardiovascular: Normal rate, regular rhythm and normal heart sounds.   Pulmonary/Chest: Effort normal and breath sounds normal.  Abdominal: Soft. Bowel sounds are normal. There is no tenderness.  Musculoskeletal: He exhibits no edema.  Neurological: He is alert and oriented to person, place, and time. No cranial nerve deficit.  Skin: Skin is warm and dry. He is not diaphoretic. There is erythema.  2x2cm area of fluctuance and erythema with surrounding 4x4cm area of erythema without induration, no drainage; there is a 4cm x 2cm area of flesh colored fibrosis w/ overlying small area of swelling that is TTP to L groin, no fluctuance, erythema    ED Course  INCISION AND DRAINAGE Date/Time: 03/11/2014 8:43 AM Performed by: Windell Hummingbird Authorized by: Hurman Horn Consent: Verbal consent obtained. written consent not  obtained. Risks and benefits: risks, benefits and alternatives were discussed Consent given by: patient Patient understanding: patient states understanding of the procedure being performed Patient consent: the patient's understanding of the procedure matches consent given Procedure consent: procedure consent matches procedure scheduled Relevant documents: relevant documents present and verified Test results: test results available and properly labeled Site marked: the operative site was not marked Imaging studies: imaging studies available Patient identity confirmed: verbally with patient Time out: Immediately prior to procedure a "time out" was called to verify the correct patient, procedure, equipment, support staff and site/side marked as required. Type: abscess Body area: upper extremity (L groin and L axilla) Anesthesia: local infiltration Local anesthetic: lidocaine 2% with epinephrine Anesthetic total: 20 ml Patient sedated: no Risk factor: underlying major vessel Scalpel size: 11 Needle gauge: 23. Incision type: single with marsupialization Complexity: simple Drainage: purulent Drainage amount: moderate Wound treatment: wound left open Packing material: none Patient tolerance: Patient tolerated the procedure well with no immediate complications.   (including critical care time) Labs Review Labs Reviewed - No data to display Imaging Review No results found.   EKG Interpretation None      MDM   This is a 37yo AAM with h/o recurrent abscesses who presents with abscesses to his L axilla and L groin. Ultrasound was performed on both areas, confirming the presence of subcutaneous pockets of fluid in both locations c/w abscess. I&D performed to both areas. Patient has no systemic symptoms, so antibiotics are not necessary at this time. Patient needs to follow up with ID for possible decolonization. He agrees to return to the ED if he develops further similar lesions and/or  fever, chills, nausea, vomiting.  Windell Hummingbird, MD 03/11/14 [phone]

## 2014-03-11 NOTE — ED Notes (Signed)
Vital signs stable. 

## 2014-03-11 NOTE — ED Notes (Signed)
Pt has 2 abscessed areas: one to the left axilla area and left groin.

## 2014-03-11 NOTE — ED Provider Notes (Signed)
I saw and evaluated the patient, reviewed the resident's note and I agree with the findings and plan.  Limited bedside ED US reveals subcut fluid collections left groin and axilla c/w abscesses.  I was physically present observed and participated for the incision and drainage procedures of the left axilla and left groin abscesses with timeout taken.   Apparently ID requested culture since recurrent abscesses (ordered).  Hurman HornJohn M Caydance Kuehnle, MD 03/13/14 [phone]

## 2014-03-11 NOTE — ED Notes (Signed)
Suture cart and I&D tray setup at bedside. 

## 2014-03-13 LAB — WOUND CULTURE

## 2014-03-21 ENCOUNTER — Encounter (HOSPITAL_COMMUNITY): Payer: Self-pay | Admitting: Emergency Medicine

## 2014-03-21 ENCOUNTER — Emergency Department (HOSPITAL_COMMUNITY)
Admission: EM | Admit: 2014-03-21 | Discharge: 2014-03-21 | Disposition: A | Discharge status: Home / Self Care | Payer: Medicare Other | Attending: Emergency Medicine | Admitting: Emergency Medicine

## 2014-03-21 DIAGNOSIS — Z87891 Personal history of nicotine dependence: Secondary | ICD-10-CM | POA: Insufficient documentation

## 2014-03-21 DIAGNOSIS — L02219 Cutaneous abscess of trunk, unspecified: Secondary | ICD-10-CM | POA: Insufficient documentation

## 2014-03-21 DIAGNOSIS — L02214 Cutaneous abscess of groin: Secondary | ICD-10-CM

## 2014-03-21 DIAGNOSIS — L03319 Cellulitis of trunk, unspecified: Principal | ICD-10-CM

## 2014-03-21 MED ORDER — LIDOCAINE HCL (PF) 1 % IJ SOLN: 30.0000 mL | Freq: Once | INTRAMUSCULAR | Status: DC

## 2014-03-21 MED ORDER — HYDROCODONE-ACETAMINOPHEN 5-325 MG PO TABS
2.0000 | ORAL_TABLET | Freq: Once | ORAL | Status: AC
  Administered 2014-03-21: 2 via ORAL
  Filled 2014-03-21: qty 2

## 2014-03-21 MED ORDER — MORPHINE SULFATE 4 MG/ML IJ SOLN: 6.0000 mg | Freq: Once | INTRAMUSCULAR | Status: DC

## 2014-03-21 MED ORDER — HYDROCODONE-ACETAMINOPHEN 5-325 MG PO TABS: 2.0000 | ORAL_TABLET | ORAL | Status: DC | PRN

## 2014-03-21 NOTE — ED Provider Notes (Signed)
CSN: [id number]     Arrival date & time 03/21/14  4098 History   First MD Initiated Contact with Patient 03/21/14 [phone]     Chief Complaint  Patient presents with  . Groin Pain     (Consider location/radiation/quality/duration/timing/severity/associated sxs/prior Treatment) HPI Comments: 38 year old male with abscess history presents with recurrent left groin swelling similar to previous. Patient had I&D approximately 9 days prior and swelling and pain improved. Patient was on narcotics for pain control. No recent antibiotics. Pain with palpation of left groin. No hernia history. No cellulitis. Gradually worsening for the past 2-3 days.  Patient is a 38 y.o. male presenting with groin pain. The history is provided by the patient.  Groin Pain This is a recurrent problem. Pertinent negatives include no abdominal pain.    Past Medical History  Diagnosis Date  . Abscess    History reviewed. No pertinent past surgical history. History reviewed. No pertinent family history. History  Substance Use Topics  . Smoking status: Former Smoker    Types: Cigarettes  . Smokeless tobacco: Not on file  . Alcohol Use: Yes     Comment: occ    Review of Systems  Constitutional: Negative for fever, chills and appetite change.  Gastrointestinal: Negative for vomiting and abdominal pain.  Genitourinary: Negative for scrotal swelling and testicular pain.  Skin: Positive for wound.      Allergies  Doxycycline and Septra  Home Medications   Current Outpatient Rx  Name  Route  Sig  Dispense  Refill  . ibuprofen (ADVIL,MOTRIN) 800 MG tablet   Oral   Take 800 mg by mouth every 8 (eight) hours as needed for headache or moderate pain.         Marland Kitchen oxyCODONE-acetaminophen (PERCOCET/ROXICET) 5-325 MG per tablet   Oral   Take 1 tablet by mouth every 4 (four) hours as needed for moderate pain or severe pain.          BP 139/80  Pulse 81  Temp(Src) 98.1 F (36.7 C) (Oral)  Resp 18  Ht 5\' 8"   (1.727 m)  Wt 203 lb (92.08 kg)  BMI 30.87 kg/m2  SpO2 97% Physical Exam  Nursing note and vitals reviewed. Constitutional: He appears well-developed and well-nourished. No distress.  HENT:  Head: Normocephalic and atraumatic.  Neck: Normal range of motion. Neck supple.  Cardiovascular: Normal rate.   Pulmonary/Chest: Effort normal.  Abdominal: Soft. He exhibits no distension.  Genitourinary:     Focal area of fluctuance, tenderness and mild erythema the left inguinal region. No hernia appreciated on exam. No crepitus or surrounding cellulitis.    ED Course  Procedures (including critical care time) EMERGENCY DEPARTMENT US SOFT TISSUE INTERPRETATION "Study: Limited Soft Tissue Ultrasound"  INDICATIONS: Pain and Soft tissue infection Multiple views of the body part were obtained in real-time with a multi-frequency linear probe PERFORMED BY:  Myself IMAGES ARCHIVED?: Yes SIDE:Left BODY PART:Other soft tisse (comment in note) FINDINGS: Abcess present and Cellulitis absent INTERPRETATION:  Abcess present and No cellulitis noted   INCISION AND DRAINAGE Performed by: Enid Skeens Consent: Verbal consent obtained. Risks and benefits: risks, benefits and alternatives were discussed Type: abscess  Body area: left groin Anesthesia: local infiltration Incision was made with a scalpel. Local anesthetic: lidocaine Anesthetic total: 5 ml Complexity: none Blunt dissection to break up loculations Drainage: 8 cc purulence  Patient tolerance: Patient tolerated the procedure well with no immediate complications.    Labs Review Labs Reviewed - No data to display  Imaging Review No results found.   EKG Interpretation None      MDM   Final diagnoses:  Inguinal abscess   Clinically inguinal abscess.\ Bedside ultrasound confirmed fluid. Patient well-appearing nondiabetic and vitals unremarkable. Plan for I and D and close outpatient followup. Pain medicines in the  ED given.  Results and differential diagnosis were discussed with the patient. Close follow up outpatient was discussed, patient comfortable with the plan.   Filed Vitals:   03/21/14 0529 03/21/14 0534 03/21/14 0648  BP: 139/80  135/72  Pulse: 81  80  Temp: 98.1 F (36.7 C)    TempSrc: Oral    Resp: 18  18  Height:  5\' 8"  (1.727 m)   Weight:  203 lb (92.08 kg)   SpO2: 97%  98%         Enid SkeensJoshua M Ailani Governale, MD 03/21/14 [phone]

## 2014-03-21 NOTE — Discharge Instructions (Signed)
Sterilize bathtub as discussed in take regular soaks to allow access to continue to drain. Take ibuprofen and use ice as needed for pain. For severe pain take norco or vicodin however realize they have the potential for addiction and it can make you sleepy and has tylenol in it.  No operating machinery while taking.   If you were given medicines take as directed.  If you are on coumadin or contraceptives realize their levels and effectiveness is altered by many different medicines.  If you have any reaction (rash, tongues swelling, other) to the medicines stop taking and see a physician.   Please follow up as directed and return to the ER or see a physician for new or worsening symptoms.  Thank you. Filed Vitals:   03/21/14 0529 03/21/14 0534 03/21/14 0648  BP: 139/80  135/72  Pulse: 81  80  Temp: 98.1 F (36.7 C)    TempSrc: Oral    Resp: 18  18  Height:  5\' 8"  (1.727 m)   Weight:  203 lb (92.08 kg)   SpO2: 97%  98%    Abscess An abscess (boil or furuncle) is an infected area on or under the skin. This area is filled with yellowish-white fluid (pus) and other material (debris). HOME CARE   Only take medicines as told by your doctor.  If you were given antibiotic medicine, take it as directed. Finish the medicine even if you start to feel better.  If gauze is used, follow your doctor's directions for changing the gauze.  To avoid spreading the infection:  Keep your abscess covered with a bandage.  Wash your hands well.  Do not share personal care items, towels, or whirlpools with others.  Avoid skin contact with others.  Keep your skin and clothes clean around the abscess.  Keep all doctor visits as told. GET HELP RIGHT AWAY IF:   You have more pain, puffiness (swelling), or redness in the wound site.  You have more fluid or blood coming from the wound site.  You have muscle aches, chills, or you feel sick.  You have a fever. MAKE SURE YOU:   Understand these  instructions.  Will watch your condition.  Will get help right away if you are not doing well or get worse. Document Released: 05/25/2008 Document Revised: 06/07/2012 Document Reviewed: 02/19/2012 [facility] Patient Information 2014 [place], [place].

## 2014-03-21 NOTE — ED Notes (Signed)
Had abscess x 2 drained in ED approx 9 days ago.  One under left arm okay, one in left groin swollen, more painful.  Area hard to touch.

## 2014-03-21 NOTE — ED Notes (Signed)
Assisted MD with I&D.  Pt tolerated well.  Wound left open, light dressing applied.

## 2015-01-08 ENCOUNTER — Emergency Department (HOSPITAL_COMMUNITY)
Admission: EM | Admit: 2015-01-08 | Discharge: 2015-01-08 | Disposition: A | Discharge status: Home / Self Care | Payer: Medicare Other | Attending: Emergency Medicine | Admitting: Emergency Medicine

## 2015-01-08 ENCOUNTER — Encounter (HOSPITAL_COMMUNITY): Payer: Self-pay

## 2015-01-08 DIAGNOSIS — L02214 Cutaneous abscess of groin: Secondary | ICD-10-CM | POA: Insufficient documentation

## 2015-01-08 DIAGNOSIS — Z87891 Personal history of nicotine dependence: Secondary | ICD-10-CM | POA: Diagnosis not present

## 2015-01-08 DIAGNOSIS — L0291 Cutaneous abscess, unspecified: Secondary | ICD-10-CM

## 2015-01-08 MED ORDER — LIDOCAINE-EPINEPHRINE 2 %-1:100000 IJ SOLN
20.0000 mL | Freq: Once | INTRAMUSCULAR | Status: AC
  Administered 2015-01-08: 20 mL via INTRADERMAL
  Filled 2015-01-08: qty 20

## 2015-01-08 MED ORDER — OXYCODONE-ACETAMINOPHEN 5-325 MG PO TABS: 1.0000 | ORAL_TABLET | Freq: Four times a day (QID) | ORAL | Status: DC | PRN

## 2015-01-08 NOTE — ED Notes (Signed)
Pt states he has a "boil" to right inner groin area, noticed it about a week ago.

## 2015-01-08 NOTE — Discharge Instructions (Signed)

## 2015-01-08 NOTE — ED Provider Notes (Signed)
CSN: [id number]     Arrival date & time 01/08/15  0531 History   First MD Initiated Contact with Patient 01/08/15 0534     Chief Complaint  Patient presents with  . Abscess     (Consider location/radiation/quality/duration/timing/severity/associated sxs/prior Treatment) Patient is a 39 y.o. male presenting with abscess. The history is provided by the patient.  Abscess Abscess location: right groin. Size:  3x3 cm Abscess quality: fluctuance, induration and painful   Red streaking: no   Duration:  3 days Progression:  Worsening Pain details:    Quality:  Dull   Severity:  Moderate   Duration:  3 days   Timing:  Constant   Progression:  Worsening Chronicity:  Recurrent Relieved by:  Nothing Worsened by:  Nothing tried Ineffective treatments:  None tried Associated symptoms: no fever, no headaches, no nausea and no vomiting     Past Medical History  Diagnosis Date  . Abscess    History reviewed. No pertinent past surgical history. No family history on file. History  Substance Use Topics  . Smoking status: Former Smoker    Types: Cigarettes  . Smokeless tobacco: Not on file  . Alcohol Use: Yes     Comment: occ    Review of Systems  Constitutional: Negative for fever.  HENT: Negative for drooling and rhinorrhea.   Eyes: Negative for pain.  Respiratory: Negative for cough and shortness of breath.   Cardiovascular: Negative for chest pain and leg swelling.  Gastrointestinal: Negative for nausea, vomiting, abdominal pain and diarrhea.  Genitourinary: Negative for dysuria and hematuria.  Musculoskeletal: Negative for gait problem and neck pain.  Skin: Negative for color change.  Neurological: Negative for numbness and headaches.  Hematological: Negative for adenopathy.  Psychiatric/Behavioral: Negative for behavioral problems.  All other systems reviewed and are negative.     Allergies  Doxycycline and Septra  Home Medications   Prior to Admission  medications   Medication Sig Start Date End Date Taking? Authorizing Provider  ibuprofen (ADVIL,MOTRIN) 800 MG tablet Take 800 mg by mouth every 8 (eight) hours as needed for headache or moderate pain.   Yes Historical Provider, MD  HYDROcodone-acetaminophen (NORCO) 5-325 MG per tablet Take 2 tablets by mouth every 4 (four) hours as needed. Patient not taking: Reported on 01/08/2015 03/21/14   Enid SkeensJoshua M Zavitz, MD   Pulse 72  Temp(Src) 97.7 F (36.5 C) (Oral)  Resp 18  Ht 5\' 8"  (1.727 m)  Wt 200 lb (90.719 kg)  BMI 30.42 kg/m2  SpO2 100% Physical Exam  Constitutional: He is oriented to person, place, and time. He appears well-developed and well-nourished.  HENT:  Head: Normocephalic and atraumatic.  Right Ear: External ear normal.  Left Ear: External ear normal.  Nose: Nose normal.  Mouth/Throat: Oropharynx is clear and moist. No oropharyngeal exudate.  Eyes: Conjunctivae and EOM are normal. Pupils are equal, round, and reactive to light.  Neck: Normal range of motion. Neck supple.  Cardiovascular: Normal rate, regular rhythm, normal heart sounds and intact distal pulses.  Exam reveals no gallop and no friction rub.   No murmur heard. Pulmonary/Chest: Effort normal and breath sounds normal. No respiratory distress. He has no wheezes.  Abdominal: Soft. Bowel sounds are normal. He exhibits no distension. There is no tenderness. There is no rebound and no guarding.  Genitourinary:  Moderate tenderness, swelling, and fluctuance noted in the right groin consistent with a small and moderate-sized abscess.  Musculoskeletal: Normal range of motion. He exhibits no edema.  Neurological: He is alert and oriented to person, place, and time.  Skin: Skin is warm and dry.  Psychiatric: He has a normal mood and affect. His behavior is normal.  Nursing note and vitals reviewed.   ED Course  INCISION AND DRAINAGE Date/Time: 01/08/2015 6:39 AM Performed by: Purvis Sheffield Authorized by: Purvis Sheffield Consent: Verbal consent obtained. Written consent obtained. Risks and benefits: risks, benefits and alternatives were discussed Consent given by: patient Patient understanding: patient states understanding of the procedure being performed Patient consent: the patient's understanding of the procedure matches consent given Procedure consent: procedure consent matches procedure scheduled Relevant documents: relevant documents present and verified Test results: test results available and properly labeled Site marked: the operative site was marked Required items: required blood products, implants, devices, and special equipment available Patient identity confirmed: verbally with patient, arm band, provided demographic data, hospital-assigned identification number and anonymous protocol, patient vented/unresponsive Time out: Immediately prior to procedure a "time out" was called to verify the correct patient, procedure, equipment, support staff and site/side marked as required. Type: abscess Location: right groin. Anesthesia: local infiltration Local anesthetic: lidocaine 2% with epinephrine Anesthetic total: 3 ml Patient sedated: no Scalpel size: 11 Incision type: single straight Complexity: simple Drainage: purulent Drainage amount: scant from upper small bascess, moderate from lower larger abscess. Wound treatment: upper small wound left open, placed a drain in the lower larger wound. Packing material: none Patient tolerance: Patient tolerated the procedure well with no immediate complications   (including critical care time) Labs Review Labs Reviewed - No data to display  Imaging Review No results found.   EKG Interpretation None      MDM   Final diagnoses:  Abscess    6:27 AM 39 y.o. male with a history of groin abscesses in the past 2 presents with a right groin abscess. He denies any other associated symptoms including fevers. Vital signs unremarkable here. He  has been seen here many times in the past for groin or scrotal abscesses. Will perform incision and drainage. Confirmed abscess w/ bedside US.   6:30 AM: I/D performed. Pt tolerated the procedure well. I have discussed the diagnosis/risks/treatment options with the patient and believe the pt to be eligible for discharge home to follow-up here as needed. I recommended he remove the drain after 2 days. We also discussed returning to the ED immediately if new or worsening sx occur. We discussed the sx which are most concerning (e.g., worsening pain, fever, spreading redness) that necessitate immediate return. Medications administered to the patient during their visit and any new prescriptions provided to the patient are listed below.  Medications given during this visit Medications  lidocaine-EPINEPHrine (XYLOCAINE W/EPI) 2 %-1:100000 (with pres) injection 20 mL (not administered)    New Prescriptions   OXYCODONE-ACETAMINOPHEN (PERCOCET) 5-325 MG PER TABLET    Take 1-2 tablets by mouth every 6 (six) hours as needed.     Purvis Sheffield, MD 01/08/15 [phone]

## 2015-02-10 ENCOUNTER — Encounter (HOSPITAL_COMMUNITY): Payer: Self-pay | Admitting: Emergency Medicine

## 2015-02-10 ENCOUNTER — Emergency Department (HOSPITAL_COMMUNITY)
Admission: EM | Admit: 2015-02-10 | Discharge: 2015-02-10 | Disposition: A | Discharge status: Home / Self Care | Payer: Medicare Other | Attending: Emergency Medicine | Admitting: Emergency Medicine

## 2015-02-10 DIAGNOSIS — Z87891 Personal history of nicotine dependence: Secondary | ICD-10-CM | POA: Insufficient documentation

## 2015-02-10 DIAGNOSIS — Z872 Personal history of diseases of the skin and subcutaneous tissue: Secondary | ICD-10-CM | POA: Insufficient documentation

## 2015-02-10 DIAGNOSIS — B084 Enteroviral vesicular stomatitis with exanthem: Secondary | ICD-10-CM | POA: Diagnosis not present

## 2015-02-10 DIAGNOSIS — R21 Rash and other nonspecific skin eruption: Secondary | ICD-10-CM | POA: Diagnosis present

## 2015-02-10 MED ORDER — IBUPROFEN 200 MG PO TABS
600.0000 mg | ORAL_TABLET | Freq: Once | ORAL | Status: AC
  Administered 2015-02-10: 600 mg via ORAL
  Filled 2015-02-10: qty 3

## 2015-02-10 MED ORDER — DIPHENHYDRAMINE HCL 25 MG PO CAPS: 25.0000 mg | ORAL_CAPSULE | Freq: Four times a day (QID) | ORAL | Status: DC | PRN

## 2015-02-10 MED ORDER — TRAMADOL HCL 50 MG PO TABS: 50.0000 mg | ORAL_TABLET | Freq: Four times a day (QID) | ORAL | Status: DC | PRN

## 2015-02-10 MED ORDER — IBUPROFEN 600 MG PO TABS: 600.0000 mg | ORAL_TABLET | Freq: Four times a day (QID) | ORAL | Status: DC | PRN

## 2015-02-10 NOTE — ED Notes (Signed)
Noticed swelling, itching, and pain in hands and feet two days ago.  Small bumps noted to hands.  States they tingle.

## 2015-02-10 NOTE — ED Provider Notes (Signed)
CSN: [id number]     Arrival date & time 02/10/15  0321 History   First MD Initiated Contact with Patient 02/10/15 0451     Chief Complaint  Patient presents with  . PAIN IN HANDS AND FEET      (Consider location/radiation/quality/duration/timing/severity/associated sxs/prior Treatment) HPI Comments: Pt comes in with itching, and rash to his feet and hands. Onset 2 days ago, no chemical exposure, or walking with naked feet outside. The lesion is staying in the same location and are itchy and painful. No n/v/f/c. No hx of skin dz.  The history is provided by the patient.    Past Medical History  Diagnosis Date  . Abscess    History reviewed. No pertinent past surgical history. No family history on file. History  Substance Use Topics  . Smoking status: Former Smoker    Types: Cigarettes  . Smokeless tobacco: Not on file  . Alcohol Use: Yes     Comment: occ    Review of Systems  Constitutional: Negative for fever.  HENT: Negative for facial swelling and mouth sores.   Skin: Positive for rash.  Allergic/Immunologic: Negative for immunocompromised state.      Allergies  Doxycycline and Septra  Home Medications   Prior to Admission medications   Medication Sig Start Date End Date Taking? Authorizing Provider  diphenhydrAMINE (BENADRYL) 25 mg capsule Take 1 capsule (25 mg total) by mouth every 6 (six) hours as needed for itching. 02/10/15   Derwood KaplanAnkit Mickael Mcnutt, MD  HYDROcodone-acetaminophen (NORCO) 5-325 MG per tablet Take 2 tablets by mouth every 4 (four) hours as needed. Patient not taking: Reported on 01/08/2015 03/21/14   Enid SkeensJoshua M Zavitz, MD  ibuprofen (ADVIL,MOTRIN) 600 MG tablet Take 1 tablet (600 mg total) by mouth every 6 (six) hours as needed. 02/10/15   Derwood KaplanAnkit Loys Hoselton, MD  oxyCODONE-acetaminophen (PERCOCET) 5-325 MG per tablet Take 1-2 tablets by mouth every 6 (six) hours as needed. Patient not taking: Reported on 02/10/2015 01/08/15   Purvis SheffieldForrest Harrison, MD  traMADol (ULTRAM)  50 MG tablet Take 1 tablet (50 mg total) by mouth every 6 (six) hours as needed. 02/10/15   Alper Guilmette Rhunette CroftNanavati, MD   BP 157/97 mmHg  Pulse 89  Temp(Src) 98.6 F (37 C) (Oral)  Resp 16  Ht 5\' 8"  (1.727 m)  Wt 200 lb (90.719 kg)  BMI 30.42 kg/m2  SpO2 100% Physical Exam  Constitutional: He appears well-developed.  HENT:  2 lesions seen in the mouth, one under the tongue and other over the superior palate.  Eyes: Conjunctivae are normal.  Neck: Neck supple.  Skin: Rash noted.  See the inserted picture below:   Nursing note and vitals reviewed.   ED Course  Procedures (including critical care time) Labs Review Labs Reviewed - No data to display  Imaging Review No results found.   EKG Interpretation None      MDM   Final diagnoses:  Hand, foot and mouth disease             Pt has multiple papular lesions, bilateral hands and feet. Palpable. No vesicles. No drainage. No skin peeling  Clinically, concerns for hand, foot and mouth, or similar viral prodrome. No allergy hx, skin condition hx. And hx is not suggestive of any particular cause. Will give meds for symptoms relief.    Derwood KaplanAnkit Alleyah Twombly, MD 02/11/15 [phone]

## 2015-02-10 NOTE — Discharge Instructions (Signed)
We suspect that you have a viral syndrome or hand foot and mouth disease. Read the information provided. Take meds as prescribed. Return if the skin is peeling off. See your doctor in 1 week,   Hand, Foot, and Mouth Disease Hand, foot, and mouth disease is a common viral illness. It occurs mainly in children younger than 41 years of age, but adolescents and adults may also get it. This disease is different than foot and mouth disease that cattle, sheep, and pigs get. Most people are better in 1 week. CAUSES  Hand, foot, and mouth disease is usually caused by a group of viruses called enteroviruses. Hand, foot, and mouth disease can spread from person to person (contagious). A person is most contagious during the first week of the illness. It is not transmitted to or from pets or other animals. It is most common in the summer and early fall. Infection is spread from person to person by direct contact with an infected person's:  Nose discharge.  Throat discharge.  Stool. SYMPTOMS  Open sores (ulcers) occur in the mouth. Symptoms may also include:  A rash on the hands and feet, and occasionally the buttocks.  Fever.  Aches.  Pain from the mouth ulcers.  Fussiness. DIAGNOSIS  Hand, foot, and mouth disease is one of many infections that cause mouth sores. To be certain your child has hand, foot, and mouth disease your caregiver will diagnose your child by physical exam.Additional tests are not usually needed. TREATMENT  Nearly all patients recover without medical treatment in 7 to 10 days. There are no common complications. Your child should only take over-the-counter or prescription medicines for pain, discomfort, or fever as directed by your caregiver. Your caregiver may recommend the use of an over-the-counter antacid or a combination of an antacid and diphenhydramine to help coat the lesions in the mouth and improve symptoms.  HOME CARE INSTRUCTIONS  Try combinations of foods to see  what your child will tolerate and aim for a balanced diet. Soft foods may be easier to swallow. The mouth sores from hand, foot, and mouth disease typically hurt and are painful when exposed to salty, spicy, or acidic food or drinks.  Milk and cold drinks are soothing for some patients. Milk shakes, frozen ice pops, slushies, and sherberts are usually well tolerated.  Sport drinks are good choices for hydration, and they also provide a few calories. Often, a child with hand, foot, and mouth disease will be able to drink without discomfort.   For younger children and infants, feeding with a cup, spoon, or syringe may be less painful than drinking through the nipple of a bottle.  Keep children out of childcare programs, schools, or other group settings during the first few days of the illness or until they are without fever. The sores on the body are not contagious. SEEK IMMEDIATE MEDICAL CARE IF:  Your child develops signs of dehydration such as:  Decreased urination.  Dry mouth, tongue, or lips.  Decreased tears or sunken eyes.  Dry skin.  Rapid breathing.  Fussy behavior.  Poor color or pale skin.  Fingertips taking longer than 2 seconds to turn pink after a gentle squeeze.  Rapid weight loss.  Your child does not have adequate pain relief.  Your child develops a severe headache, stiff neck, or change in behavior.  Your child develops ulcers or blisters that occur on the lips or outside of the mouth. Document Released: 09/05/2003 Document Revised: 02/29/2012 Document Reviewed: 05/21/2011 ExitCare  Patient Information ©2015 [employer]. This information is not intended to replace advice given to you by your health care provider. Make sure you discuss any questions you have with your health care provider. ° °

## 2015-07-07 ENCOUNTER — Emergency Department (HOSPITAL_COMMUNITY)
Admission: EM | Admit: 2015-07-07 | Discharge: 2015-07-07 | Disposition: A | Discharge status: Home / Self Care | Payer: Medicare Other | Attending: Emergency Medicine | Admitting: Emergency Medicine

## 2015-07-07 ENCOUNTER — Encounter (HOSPITAL_COMMUNITY): Payer: Self-pay | Admitting: Emergency Medicine

## 2015-07-07 ENCOUNTER — Emergency Department (HOSPITAL_COMMUNITY): Payer: Medicare Other

## 2015-07-07 DIAGNOSIS — L02214 Cutaneous abscess of groin: Secondary | ICD-10-CM | POA: Diagnosis present

## 2015-07-07 DIAGNOSIS — Z87891 Personal history of nicotine dependence: Secondary | ICD-10-CM | POA: Insufficient documentation

## 2015-07-07 DIAGNOSIS — L0291 Cutaneous abscess, unspecified: Secondary | ICD-10-CM

## 2015-07-07 LAB — CBC WITH DIFFERENTIAL/PLATELET
BASOS ABS: 0 10*3/uL (ref 0.0–0.1)
Basophils Relative: 0 % (ref 0–1)
EOS ABS: 0.1 10*3/uL (ref 0.0–0.7)
EOS PCT: 1 % (ref 0–5)
HEMATOCRIT: 38.9 % — AB (ref 39.0–52.0)
Hemoglobin: 13.1 g/dL (ref 13.0–17.0)
Lymphocytes Relative: 23 % (ref 12–46)
Lymphs Abs: 2 10*3/uL (ref 0.7–4.0)
MCH: 29.2 pg (ref 26.0–34.0)
MCHC: 33.7 g/dL (ref 30.0–36.0)
MCV: 86.6 fL (ref 78.0–100.0)
MONO ABS: 0.6 10*3/uL (ref 0.1–1.0)
Monocytes Relative: 7 % (ref 3–12)
Neutro Abs: 5.9 10*3/uL (ref 1.7–7.7)
Neutrophils Relative %: 69 % (ref 43–77)
PLATELETS: 259 10*3/uL (ref 150–400)
RBC: 4.49 MIL/uL (ref 4.22–5.81)
RDW: 14.1 % (ref 11.5–15.5)
WBC: 8.6 10*3/uL (ref 4.0–10.5)

## 2015-07-07 LAB — CBG MONITORING, ED: Glucose-Capillary: 114 mg/dL — ABNORMAL HIGH (ref 65–99)

## 2015-07-07 LAB — BASIC METABOLIC PANEL
Anion gap: 5 (ref 5–15)
BUN: 7 mg/dL (ref 6–20)
CALCIUM: 9 mg/dL (ref 8.9–10.3)
CO2: 27 mmol/L (ref 22–32)
Chloride: 107 mmol/L (ref 101–111)
Creatinine, Ser: 1.08 mg/dL (ref 0.61–1.24)
Glucose, Bld: 101 mg/dL — ABNORMAL HIGH (ref 65–99)
Potassium: 3.6 mmol/L (ref 3.5–5.1)
Sodium: 139 mmol/L (ref 135–145)

## 2015-07-07 MED ORDER — HYDROCODONE-ACETAMINOPHEN 5-325 MG PO TABS: 1.0000 | ORAL_TABLET | ORAL | Status: DC | PRN

## 2015-07-07 MED ORDER — AMOXICILLIN-POT CLAVULANATE 875-125 MG PO TABS: 1.0000 | ORAL_TABLET | Freq: Two times a day (BID) | ORAL | Status: DC

## 2015-07-07 MED ORDER — LIDOCAINE-EPINEPHRINE (PF) 2 %-1:200000 IJ SOLN
10.0000 mL | Freq: Once | INTRAMUSCULAR | Status: AC
  Administered 2015-07-07: 10 mL
  Filled 2015-07-07: qty 20

## 2015-07-07 MED ORDER — IOHEXOL 300 MG/ML  SOLN
100.0000 mL | Freq: Once | INTRAMUSCULAR | Status: AC | PRN
  Administered 2015-07-07: 100 mL via INTRAVENOUS

## 2015-07-07 MED ORDER — MORPHINE SULFATE 4 MG/ML IJ SOLN
4.0000 mg | Freq: Once | INTRAMUSCULAR | Status: AC
  Administered 2015-07-07: 4 mg via INTRAVENOUS
  Filled 2015-07-07: qty 1

## 2015-07-07 MED ORDER — KETOROLAC TROMETHAMINE 60 MG/2ML IM SOLN
60.0000 mg | Freq: Once | INTRAMUSCULAR | Status: AC
  Administered 2015-07-07: 60 mg via INTRAMUSCULAR
  Filled 2015-07-07: qty 2

## 2015-07-07 MED ORDER — ONDANSETRON HCL 4 MG/2ML IJ SOLN
4.0000 mg | Freq: Once | INTRAMUSCULAR | Status: AC
  Administered 2015-07-07: 4 mg via INTRAVENOUS
  Filled 2015-07-07: qty 2

## 2015-07-07 NOTE — ED Notes (Signed)
Pt transported to CT scan.

## 2015-07-07 NOTE — ED Provider Notes (Signed)
CSN: [id number]     Arrival date & time 07/07/15  [id number] History  This chart was scribed for Marlon Peliffany Athelene Hursey, PA-C, working with Nelva Nayobert Beaton, MD by Chestine SporeSoijett Blue, ED Scribe. The patient was seen in room TR09C/TR09C at 9:17 AM.     Chief Complaint  Patient presents with  . Abscess    The patient is being treated for a boil on the back of his leg.  He is on Clindamycin for it that was prescribed from his PCP.        The history is provided by the patient. No language interpreter was used.    HPI Comments: Logan Rodriguez is a 39 y.o. male with a medical hx of abscess who presents to the Emergency Department complaining of abscess to the back of his leg onset 2 days ago. Pt reports that he saw his PCP for his symptoms and was Rx Clindamycin without pain medications for an abscess that was on the back of his leg. Pt reports that another abscess appeared after taking the abx to his right groin area. Pt reports that he has had another abscess to this same area that was I&D multiple times in the past. Pt reports that he has not had a surgeon I&D any abscess before. Pt was Rx abx by his PCP in Family practice in [place] and pt is unsure of the last time he had blood work completed.Pt denies the abscess going down his leg. He denies rectal pain, v/d, testicular pain, penile pain, and any other symptoms. Pt reports that he has not been dx as a diabetic.   Past Medical History  Diagnosis Date  . Abscess    History reviewed. No pertinent past surgical history. History reviewed. No pertinent family history. History  Substance Use Topics  . Smoking status: Former Smoker    Types: Cigarettes  . Smokeless tobacco: Not on file  . Alcohol Use: Yes     Comment: occ    Review of Systems  Gastrointestinal: Negative for vomiting, diarrhea and rectal pain.  Genitourinary: Negative for dysuria and difficulty urinating.  Skin: Negative for color change.       Abscess to right groin       Allergies  Doxycycline and Septra  Home Medications   Prior to Admission medications   Medication Sig Start Date End Date Taking? Authorizing Provider  diphenhydrAMINE (BENADRYL) 25 mg capsule Take 1 capsule (25 mg total) by mouth every 6 (six) hours as needed for itching. 02/10/15   Derwood KaplanAnkit Nanavati, MD  HYDROcodone-acetaminophen (NORCO) 5-325 MG per tablet Take 2 tablets by mouth every 4 (four) hours as needed. Patient not taking: Reported on 01/08/2015 03/21/14   Blane OharaJoshua Zavitz, MD  ibuprofen (ADVIL,MOTRIN) 600 MG tablet Take 1 tablet (600 mg total) by mouth every 6 (six) hours as needed. 02/10/15   Derwood KaplanAnkit Nanavati, MD  oxyCODONE-acetaminophen (PERCOCET) 5-325 MG per tablet Take 1-2 tablets by mouth every 6 (six) hours as needed. Patient not taking: Reported on 02/10/2015 01/08/15   Purvis SheffieldForrest Harrison, MD  traMADol (ULTRAM) 50 MG tablet Take 1 tablet (50 mg total) by mouth every 6 (six) hours as needed. 02/10/15   Ankit Nanavati, MD   BP 136/101 mmHg  Pulse 84  Temp(Src) 97.3 F (36.3 C) (Oral)  Resp 16  SpO2 100% Physical Exam  Constitutional: He is oriented to person, place, and time. He appears well-developed and well-nourished. No distress.  HENT:  Head: Normocephalic and atraumatic.  Eyes: EOM are normal.  Neck:  Neck supple. No tracheal deviation present.  Cardiovascular: Normal rate.   Pulmonary/Chest: Effort normal. No respiratory distress.  Abdominal: Bowel sounds are normal. He exhibits no distension. There is no tenderness. There is no rigidity, no rebound, no guarding and no CVA tenderness.  Musculoskeletal: Normal range of motion.       Legs: Neurological: He is alert and oriented to person, place, and time.  Skin: Skin is warm and dry.  Psychiatric: He has a normal mood and affect. His behavior is normal.  Nursing note and vitals reviewed.   ED Course  Procedures (including critical care time) DIAGNOSTIC STUDIES: Oxygen Saturation is 100% on RA, nl by my  interpretation.    COORDINATION OF CARE: 9:20 AM-Discussed treatment plan which includes I&D with pt at bedside and pt agreed to plan.   Labs Review Labs Reviewed  CBG MONITORING, ED - Abnormal; Notable for the following:    Glucose-Capillary 114 (*)    All other components within normal limits  CBC WITH DIFFERENTIAL/PLATELET  BASIC METABOLIC PANEL    Imaging Review No results found.   EKG Interpretation None      MDM   Final diagnoses:  None     INCISION AND DRAINAGE Performed by: Dorthula Matas Consent: Verbal consent obtained. Risks and benefits: risks, benefits and alternatives were discussed Type: abscess  Body area: right inguinal region  Anesthesia: local infiltration  Incision was made with a scalpel.  Local anesthetic: lidocaine 2% with epinephrine  Anesthetic total: 4 ml  Complexity: complex  Drainage: NONE  Drainage amount: NONE  Patient I&D attempted, no puss return. With the use of ultrasound machine, a fluid collection is identified however when I probe the incision I am not close to the level of the fluid collection.  Patient to be moved to Acute bed.  Filed Vitals:   07/07/15 0844  BP: 136/101  Pulse: 84  Temp: 97.3 F (36.3 C)  Resp: 16   Dr. Rolland Porter has agreed to assume care of the patient.-- CT Pelvis w contrast pending. CBC and BMP.     I personally performed the services described in this documentation, which was scribed in my presence. The recorded information has been reviewed and is accurate.    Marlon Pel, PA-C 07/07/15 1031  Nelva Nay, MD 07/08/15 [phone]

## 2015-07-07 NOTE — Discharge Instructions (Signed)
Remove 1 inch of gauze tomorrow, 1 inch on Tuesday, then remove the remainder of the gauze on Wednesday. Begin soaks and gentle massage of the area after the gauze has been removed.  Abscess An abscess is an infected area that contains a collection of pus and debris.It can occur in almost any part of the body. An abscess is also known as a furuncle or boil. CAUSES  An abscess occurs when tissue gets infected. This can occur from blockage of oil or sweat glands, infection of hair follicles, or a minor injury to the skin. As the body tries to fight the infection, pus collects in the area and creates pressure under the skin. This pressure causes pain. People with weakened immune systems have difficulty fighting infections and get certain abscesses more often.  SYMPTOMS Usually an abscess develops on the skin and becomes a painful mass that is red, warm, and tender. If the abscess forms under the skin, you may feel a moveable soft area under the skin. Some abscesses break open (rupture) on their own, but most will continue to get worse without care. The infection can spread deeper into the body and eventually into the bloodstream, causing you to feel ill.  DIAGNOSIS  Your caregiver will take your medical history and perform a physical exam. A sample of fluid may also be taken from the abscess to determine what is causing your infection. TREATMENT  Your caregiver may prescribe antibiotic medicines to fight the infection. However, taking antibiotics alone usually does not cure an abscess. Your caregiver may need to make a small cut (incision) in the abscess to drain the pus. In some cases, gauze is packed into the abscess to reduce pain and to continue draining the area. HOME CARE INSTRUCTIONS   Only take over-the-counter or prescription medicines for pain, discomfort, or fever as directed by your caregiver.  If you were prescribed antibiotics, take them as directed. Finish them even if you start to feel  better.  If gauze is used, follow your caregiver's directions for changing the gauze.  To avoid spreading the infection:  Keep your draining abscess covered with a bandage.  Wash your hands well.  Do not share personal care items, towels, or whirlpools with others.  Avoid skin contact with others.  Keep your skin and clothes clean around the abscess.  Keep all follow-up appointments as directed by your caregiver. SEEK MEDICAL CARE IF:   You have increased pain, swelling, redness, fluid drainage, or bleeding.  You have muscle aches, chills, or a general ill feeling.  You have a fever. MAKE SURE YOU:   Understand these instructions.  Will watch your condition.  Will get help right away if you are not doing well or get worse. Document Released: 09/16/2005 Document Revised: 06/07/2012 Document Reviewed: 02/19/2012 [facility] Patient Information 2015 [place], [place]. This information is not intended to replace advice given to you by your health care provider. Make sure you discuss any questions you have with your health care provider.

## 2015-07-07 NOTE — ED Provider Notes (Signed)
Patient seen and evaluated. Care discussed with Ellin Sabaiffany Green PA-C. Care assumed.  CT shows superficial right groin abscess. Localize with ultrasound. Anesthetize once and lidocaine. Stab incision made. Initially moderate amount of thin fluid followed by the parents. Irrigated with 18-gauge Angiocath until clear effluent.   INCISION AND DRAINAGE Performed by: Claudean KindsJAMES, Ammaar Encina JOSEPH Consent: Verbal consent obtained. Risks and benefits: risks, benefits and alternatives were discussed Type: abscess  Body area: right groin  Anesthesia: local infiltration  Incision was made with a scalpel.  Local anesthetic: lidocaine % c epinephrine  Anesthetic total: 4 ml  Complexity: complex Blunt dissection to break up loculations  Drainage: purulent  Drainage amount: moderate  Packing material: 1/4 in iodoform gauze  Patient tolerance: Patient tolerated the procedure well with no immediate complications.    Form gauze. Covered with ADD. Plan is home, remove 1 inch of gauze tomorrow, when she doesn't stay, remove the remainder the gauze on day 3. Begin soaks. Let Augmentin to his current clinical in. Primary care follow-up. ER with any worsening.  Logan PorterMark Rella Egelston, MD 07/07/15 1320

## 2015-07-07 NOTE — ED Notes (Signed)
Patient transported to CT 

## 2015-07-07 NOTE — ED Notes (Signed)
The patient is being treated for a boil on the back of his leg.  He is on Clindamycin for it that was prescribed from his PCP.  The patient said it came up after he started taking his medication.  He rates his pain 10/10.

## 2015-08-10 ENCOUNTER — Encounter (HOSPITAL_COMMUNITY): Payer: Self-pay | Admitting: *Deleted

## 2015-08-10 ENCOUNTER — Emergency Department (HOSPITAL_COMMUNITY)
Admission: EM | Admit: 2015-08-10 | Discharge: 2015-08-10 | Disposition: A | Discharge status: Home / Self Care | Payer: Medicare Other | Attending: Emergency Medicine | Admitting: Emergency Medicine

## 2015-08-10 DIAGNOSIS — Z87891 Personal history of nicotine dependence: Secondary | ICD-10-CM | POA: Insufficient documentation

## 2015-08-10 DIAGNOSIS — Z792 Long term (current) use of antibiotics: Secondary | ICD-10-CM | POA: Diagnosis not present

## 2015-08-10 DIAGNOSIS — L089 Local infection of the skin and subcutaneous tissue, unspecified: Secondary | ICD-10-CM | POA: Diagnosis present

## 2015-08-10 DIAGNOSIS — L02215 Cutaneous abscess of perineum: Secondary | ICD-10-CM | POA: Insufficient documentation

## 2015-08-10 MED ORDER — HYDROCODONE-ACETAMINOPHEN 5-325 MG PO TABS: ORAL_TABLET | ORAL | Status: DC

## 2015-08-10 MED ORDER — CLINDAMYCIN HCL 150 MG PO CAPS: 300.0000 mg | ORAL_CAPSULE | Freq: Four times a day (QID) | ORAL | Status: DC

## 2015-08-10 MED ORDER — LIDOCAINE-EPINEPHRINE (PF) 2 %-1:200000 IJ SOLN
20.0000 mL | Freq: Once | INTRAMUSCULAR | Status: AC
  Administered 2015-08-10: 20 mL
  Filled 2015-08-10: qty 20

## 2015-08-10 NOTE — ED Notes (Signed)
Pt reports recurrent skin infection to perineum. Pt reports pain to site 10/10. Pt was last treated with antibx.

## 2015-08-10 NOTE — ED Notes (Signed)
Declined W/C at D/C and was escorted to lobby by RN. 

## 2015-08-10 NOTE — Discharge Instructions (Signed)
Please read and follow all provided instructions.  Your diagnoses today include:  1. Perineal abscess, superficial     Tests performed today include:  Vital signs. See below for your results today.   Medications prescribed:   Clindamycin - antibiotic  You have been prescribed an antibiotic medicine: take the entire course of medicine even if you are feeling better. Stopping early can cause the antibiotic not to work.   Vicodin (hydrocodone/acetaminophen) - narcotic pain medication  DO NOT drive or perform any activities that require you to be awake and alert because this medicine can make you drowsy. BE VERY CAREFUL not to take multiple medicines containing Tylenol (also called acetaminophen). Doing so can lead to an overdose which can damage your liver and cause liver failure and possibly death.  Take any prescribed medications only as directed.   Home care instructions:   Follow any educational materials contained in this packet  Follow-up instructions: Take the packing out at home in 48 hours. Return to the Emergency Department for a recheck if your symptoms are not significantly improved.   Please follow-up with your primary care provider in the next 1 week for further evaluation of your symptoms.   Return instructions:  Return to the Emergency Department if you have:  Fever  Worsening symptoms  Worsening pain  Worsening swelling  Redness of the skin that moves away from the affected area, especially if it streaks away from the affected area   Any other emergent concerns  Your vital signs today were: BP 150/90 mmHg   Pulse 75   Temp(Src) 98.3 F (36.8 C) (Oral)   Resp 16   Ht  (1.727 m)   Wt 200 lb (90.719 kg)   BMI 30.42 kg/m2   SpO2 100% If your blood pressure (BP) was elevated above 135/85 this visit, please have this repeated by your doctor within one month. --------------

## 2015-08-10 NOTE — ED Provider Notes (Signed)
CSN: [id number]     Arrival date & time 08/10/15  0713 History   First MD Initiated Contact with Patient 08/10/15 [phone]     Chief Complaint  Patient presents with  . 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 Area has become severely tender and painful. Patient denies fever, nausea or vomiting. No difficulty or change in bowel movements or with urination. Patient was trying to wait to see his primary care doctor. He states he has not taken any pain medications prior to arrival. He is applied heating pad and warm compresses to the site without relief. No other medical complaints. Allergies to doxycycline and Septra.  The history is provided by the patient and medical records.    Past Medical History  Diagnosis Date  . Abscess    History reviewed. No pertinent past surgical history. History reviewed. No pertinent family history. Social History  Substance Use Topics  . Smoking status: Former Smoker    Types: Cigarettes  . Smokeless tobacco: None  . Alcohol Use: Yes     Comment: occ    Review of Systems  Constitutional: Negative for fever.  Gastrointestinal: Negative for nausea, vomiting, constipation and blood in stool.  Genitourinary: Negative for dysuria, hematuria, scrotal swelling and testicular pain.  Skin: Negative for color change.       Positive for abscess  Hematological: Negative for adenopathy.      Allergies  Doxycycline and Septra  Home Medications   Prior to Admission medications   Medication Sig Start Date End Date Taking? Authorizing Provider  amoxicillin-clavulanate (AUGMENTIN) 875-125 MG per tablet Take 1 tablet by mouth 2 (two) times daily. 07/07/15   Rolland Porter, MD  clindamycin (CLEOCIN) 300 MG capsule Take 300 mg by  mouth 3 (three) times daily.    Historical Provider, MD  HYDROcodone-acetaminophen (NORCO/VICODIN) 5-325 MG per tablet Take 1 tablet by mouth every 4 (four) hours as needed. 07/07/15   Rolland Porter, MD  ibuprofen (ADVIL,MOTRIN) 600 MG tablet Take 1 tablet (600 mg total) by mouth every 6 (six) hours as needed. 02/10/15   Ankit Nanavati, MD   BP 150/90 mmHg  Pulse 75  Temp(Src) 98.3 F (36.8 C) (Oral)  Resp 16  Ht  (1.727 m)  Wt 200 lb (90.719 kg)  BMI 30.42 kg/m2  SpO2 100% Physical Exam  Constitutional: He appears well-developed and well-nourished.  HENT:  Head: Normocephalic and atraumatic.  Eyes: Conjunctivae are normal.  Neck: Normal range of motion. Neck supple.  Pulmonary/Chest: No respiratory distress.  Genitourinary:  Patient with moderate-sized (5cm x 2cm) perineal abscess which extends toward scrotum however no extension into the scrotum seen or palpated. Testicles are nontender.  Neurological: He is alert.  Skin: Skin is warm and dry.  Psychiatric: He has a normal mood and affect.  Nursing note and vitals reviewed.   ED Course  Procedures (including critical care time) Labs Review Labs Reviewed - No data to display  Imaging Review No results found. I have personally reviewed and evaluated these images and lab results as part of my medical decision-making.   EKG Interpretation None       7:36 AM Patient seen and examined. Medications ordered.   Vital signs reviewed and are as follows: BP 150/90 mmHg  Pulse 75  Temp(Src) 98.3 F (36.8 C) (Oral)  Resp 16  Ht 5\' 8"  (1.727 m)  Wt 200 lb (90.719 kg)  BMI 30.42 kg/m2  SpO2 100%  EMERGENCY DEPARTMENT US SOFT TISSUE INTERPRETATION "Study: Limited Ultrasound of the noted body part in comments below"  INDICATIONS: Soft tissue infection Multiple views of the body part are obtained with a multi-frequency linear probe  PERFORMED BY:  Myself  IMAGES ARCHIVED?: Yes  SIDE:Midline  BODY PART: perineal    FINDINGS: Abcess present  LIMITATIONS:  none  INTERPRETATION:  Abcess present  COMMENT: abscess was more diffuse and loculated mid-line and there was a more discrete fluid collection right of midline  INCISION AND DRAINAGE Performed by: Carolee Rota Consent: Verbal consent obtained. Risks and benefits: risks, benefits and alternatives were discussed Type: abscess  Body area: perineal  Anesthesia: local infiltration  Incision was made with a scalpel.  Local anesthetic: lidocaine 2% with epinephrine  Anesthetic total: 5 ml  Complexity: complex Blunt dissection to break up loculations  Drainage: purulent  Drainage amount: large  Packing material: 1/4 in iodoform gauze  Patient tolerance: Patient tolerated the procedure well with no immediate complications.    Area was prepped and a small shallow (1cm) stab incision was made just right of midline over the most fluctuant area. This yielded a small pocket but no significant return of pus. The [name] was then used and a larger fluid collection was noted lateral to this about 1cm in depth. A second shallow incision was made approximately 1cm in length and a large amount of purulent fluid was expressed. The area was then loosely packed to keep it draining.   The patient was urged to return to the Emergency Department urgently with worsening pain, swelling, expanding erythema especially if it streaks away from the affected area, fever, or if they have any other concerns.   The patient was instructed to remove packing at home in 48 hours and return to the Emergency Department or go to their PCP at that time for a recheck if their symptoms are not entirely resolved.  The patient verbalized understanding and stated agreement with this plan.   Patient counseled on use of narcotic pain medications. Counseled not to combine these medications with others containing tylenol. Urged not to drink alcohol, drive, or perform any other  activities that requires focus while taking these medications. The patient verbalizes understanding and agrees with the plan.   MDM   Final diagnoses:  Perineal abscess, superficial   Patient with skin abscess amenable to incision and drainage. Care was taken to make shalow incisions. Abscess was large enough to warrant packing with removal and wound recheck in 2 days. Abx given location, loculated appearance on [name] making complete drainage difficult.     Renne Crigler, PA-C 08/10/15 1610  Glynn Octave, MD 08/10/15 [phone]

## 2015-12-11 ENCOUNTER — Observation Stay (HOSPITAL_COMMUNITY): Payer: Medicare Other | Admitting: Anesthesiology

## 2015-12-11 ENCOUNTER — Encounter (HOSPITAL_COMMUNITY): Payer: Self-pay | Admitting: Nurse Practitioner

## 2015-12-11 ENCOUNTER — Encounter (HOSPITAL_COMMUNITY)
Admission: EM | Disposition: A | Discharge status: Home / Self Care | Payer: Self-pay | Source: Home / Self Care | Attending: Emergency Medicine

## 2015-12-11 ENCOUNTER — Emergency Department (HOSPITAL_COMMUNITY): Payer: Medicare Other

## 2015-12-11 ENCOUNTER — Observation Stay (HOSPITAL_COMMUNITY)
Admission: EM | Admit: 2015-12-11 | Discharge: 2015-12-12 | Disposition: A | Discharge status: Home / Self Care | Payer: Medicare Other | Attending: General Surgery | Admitting: General Surgery

## 2015-12-11 DIAGNOSIS — Z6830 Body mass index (BMI) 30.0-30.9, adult: Secondary | ICD-10-CM | POA: Diagnosis not present

## 2015-12-11 DIAGNOSIS — L02215 Cutaneous abscess of perineum: Principal | ICD-10-CM | POA: Diagnosis present

## 2015-12-11 DIAGNOSIS — Z87820 Personal history of traumatic brain injury: Secondary | ICD-10-CM | POA: Insufficient documentation

## 2015-12-11 DIAGNOSIS — Z87891 Personal history of nicotine dependence: Secondary | ICD-10-CM | POA: Insufficient documentation

## 2015-12-11 HISTORY — PX: INCISION AND DRAINAGE PERIRECTAL ABSCESS: SHX1804

## 2015-12-11 LAB — CBC WITH DIFFERENTIAL/PLATELET
BASOS ABS: 0 10*3/uL (ref 0.0–0.1)
BASOS PCT: 0 %
EOS ABS: 0 10*3/uL (ref 0.0–0.7)
Eosinophils Relative: 0 %
HEMATOCRIT: 39.4 % (ref 39.0–52.0)
Hemoglobin: 13.4 g/dL (ref 13.0–17.0)
Lymphocytes Relative: 11 %
Lymphs Abs: 1.7 10*3/uL (ref 0.7–4.0)
MCH: 29.6 pg (ref 26.0–34.0)
MCHC: 34 g/dL (ref 30.0–36.0)
MCV: 87.2 fL (ref 78.0–100.0)
MONO ABS: 1.1 10*3/uL — AB (ref 0.1–1.0)
Monocytes Relative: 7 %
NEUTROS ABS: 12.5 10*3/uL — AB (ref 1.7–7.7)
Neutrophils Relative %: 82 %
Platelets: 313 10*3/uL (ref 150–400)
RBC: 4.52 MIL/uL (ref 4.22–5.81)
RDW: 14.5 % (ref 11.5–15.5)
WBC: 15.3 10*3/uL — ABNORMAL HIGH (ref 4.0–10.5)

## 2015-12-11 LAB — COMPREHENSIVE METABOLIC PANEL
ALT: 14 U/L — ABNORMAL LOW (ref 17–63)
AST: 17 U/L (ref 15–41)
Albumin: 3.8 g/dL (ref 3.5–5.0)
Alkaline Phosphatase: 62 U/L (ref 38–126)
Anion gap: 10 (ref 5–15)
BUN: 6 mg/dL (ref 6–20)
CHLORIDE: 102 mmol/L (ref 101–111)
CO2: 25 mmol/L (ref 22–32)
Calcium: 9.3 mg/dL (ref 8.9–10.3)
Creatinine, Ser: 1.3 mg/dL — ABNORMAL HIGH (ref 0.61–1.24)
GFR calc Af Amer: >60 mL/min (ref >60)
GFR calc non Af Amer: >60 mL/min (ref >60)
Glucose, Bld: 104 mg/dL — ABNORMAL HIGH (ref 65–99)
Potassium: 4 mmol/L (ref 3.5–5.1)
SODIUM: 137 mmol/L (ref 135–145)
Total Bilirubin: 1 mg/dL (ref 0.3–1.2)
Total Protein: 7.4 g/dL (ref 6.5–8.1)

## 2015-12-11 LAB — URINALYSIS, ROUTINE W REFLEX MICROSCOPIC
Bilirubin Urine: NEGATIVE
GLUCOSE, UA: NEGATIVE mg/dL
Hgb urine dipstick: NEGATIVE
Ketones, ur: 15 mg/dL — AB
LEUKOCYTES UA: NEGATIVE
NITRITE: NEGATIVE
PH: 7.5 (ref 5.0–8.0)
Protein, ur: NEGATIVE mg/dL
SPECIFIC GRAVITY, URINE: 1.01 (ref 1.005–1.030)

## 2015-12-11 LAB — GRAM STAIN

## 2015-12-11 SURGERY — INCISION AND DRAINAGE, ABSCESS, PERIRECTAL
Anesthesia: General | Site: Perineum | Laterality: Left

## 2015-12-11 MED ORDER — FENTANYL CITRATE (PF) 250 MCG/5ML IJ SOLN
INTRAMUSCULAR | Status: AC
  Filled 2015-12-11: qty 5

## 2015-12-11 MED ORDER — HYDROMORPHONE HCL 1 MG/ML IJ SOLN
0.2500 mg | INTRAMUSCULAR | Status: DC | PRN
  Administered 2015-12-11 (×4): 0.5 mg via INTRAVENOUS

## 2015-12-11 MED ORDER — LIDOCAINE HCL (CARDIAC) 20 MG/ML IV SOLN
INTRAVENOUS | Status: DC | PRN
  Administered 2015-12-11: 20 mg via INTRAVENOUS

## 2015-12-11 MED ORDER — PROPOFOL 10 MG/ML IV BOLUS
INTRAVENOUS | Status: AC
  Filled 2015-12-11: qty 20

## 2015-12-11 MED ORDER — MIDAZOLAM HCL 2 MG/2ML IJ SOLN
INTRAMUSCULAR | Status: AC
  Filled 2015-12-11: qty 2

## 2015-12-11 MED ORDER — VANCOMYCIN HCL IN DEXTROSE 1-5 GM/200ML-% IV SOLN
1000.0000 mg | Freq: Three times a day (TID) | INTRAVENOUS | Status: DC
  Administered 2015-12-11 – 2015-12-12 (×3): 1000 mg via INTRAVENOUS
  Filled 2015-12-11 (×6): qty 200

## 2015-12-11 MED ORDER — ONDANSETRON 4 MG PO TBDP: 4.0000 mg | ORAL_TABLET | Freq: Four times a day (QID) | ORAL | Status: DC | PRN

## 2015-12-11 MED ORDER — LACTATED RINGERS IV SOLN
INTRAVENOUS | Status: DC
  Administered 2015-12-12: 05:00:00 via INTRAVENOUS

## 2015-12-11 MED ORDER — HYDROMORPHONE HCL 1 MG/ML IJ SOLN
INTRAMUSCULAR | Status: AC
  Filled 2015-12-11: qty 1

## 2015-12-11 MED ORDER — MIDAZOLAM HCL 5 MG/5ML IJ SOLN
INTRAMUSCULAR | Status: DC | PRN
  Administered 2015-12-11: 2 mg via INTRAVENOUS

## 2015-12-11 MED ORDER — IOHEXOL 300 MG/ML  SOLN
100.0000 mL | Freq: Once | INTRAMUSCULAR | Status: AC | PRN
  Administered 2015-12-11: 100 mL via INTRAVENOUS

## 2015-12-11 MED ORDER — FENTANYL CITRATE (PF) 100 MCG/2ML IJ SOLN
INTRAMUSCULAR | Status: DC | PRN
  Administered 2015-12-11: 75 ug via INTRAVENOUS
  Administered 2015-12-11: 50 ug via INTRAVENOUS
  Administered 2015-12-11 (×3): 25 ug via INTRAVENOUS

## 2015-12-11 MED ORDER — ACETAMINOPHEN 325 MG PO TABS
650.0000 mg | ORAL_TABLET | Freq: Once | ORAL | Status: AC
  Administered 2015-12-11: 650 mg via ORAL
  Filled 2015-12-11: qty 2

## 2015-12-11 MED ORDER — ONDANSETRON HCL 4 MG/2ML IJ SOLN
4.0000 mg | Freq: Once | INTRAMUSCULAR | Status: AC
  Administered 2015-12-11: 4 mg via INTRAVENOUS
  Filled 2015-12-11: qty 2

## 2015-12-11 MED ORDER — PROPOFOL 10 MG/ML IV BOLUS
INTRAVENOUS | Status: DC | PRN
  Administered 2015-12-11: 200 mg via INTRAVENOUS

## 2015-12-11 MED ORDER — SODIUM CHLORIDE 0.9 % IV BOLUS (SEPSIS)
1000.0000 mL | Freq: Once | INTRAVENOUS | Status: AC
  Administered 2015-12-11: 1000 mL via INTRAVENOUS

## 2015-12-11 MED ORDER — ONDANSETRON HCL 4 MG/2ML IJ SOLN
INTRAMUSCULAR | Status: AC
  Filled 2015-12-11: qty 2

## 2015-12-11 MED ORDER — LACTATED RINGERS IV SOLN
INTRAVENOUS | Status: DC | PRN
  Administered 2015-12-11: 14:00:00 via INTRAVENOUS

## 2015-12-11 MED ORDER — 0.9 % SODIUM CHLORIDE (POUR BTL) OPTIME
TOPICAL | Status: DC | PRN
  Administered 2015-12-11: 1000 mL

## 2015-12-11 MED ORDER — ONDANSETRON HCL 4 MG/2ML IJ SOLN: 4.0000 mg | Freq: Four times a day (QID) | INTRAMUSCULAR | Status: DC | PRN

## 2015-12-11 MED ORDER — HYDROCODONE-ACETAMINOPHEN 5-325 MG PO TABS
1.0000 | ORAL_TABLET | ORAL | Status: DC | PRN
  Administered 2015-12-11 – 2015-12-12 (×3): 2 via ORAL
  Filled 2015-12-11 (×3): qty 2

## 2015-12-11 MED ORDER — PIPERACILLIN-TAZOBACTAM 3.375 G IVPB
3.3750 g | Freq: Three times a day (TID) | INTRAVENOUS | Status: DC
  Administered 2015-12-11 – 2015-12-12 (×3): 3.375 g via INTRAVENOUS
  Filled 2015-12-11 (×7): qty 50

## 2015-12-11 MED ORDER — ROCURONIUM BROMIDE 50 MG/5ML IV SOLN
INTRAVENOUS | Status: AC
  Filled 2015-12-11: qty 1

## 2015-12-11 MED ORDER — ONDANSETRON HCL 4 MG/2ML IJ SOLN
INTRAMUSCULAR | Status: DC | PRN
  Administered 2015-12-11: 4 mg via INTRAVENOUS

## 2015-12-11 MED ORDER — MORPHINE SULFATE (PF) 2 MG/ML IV SOLN: 1.0000 mg | INTRAVENOUS | Status: DC | PRN

## 2015-12-11 MED ORDER — MEPERIDINE HCL 25 MG/ML IJ SOLN: 6.2500 mg | INTRAMUSCULAR | Status: DC | PRN

## 2015-12-11 MED ORDER — MORPHINE SULFATE (PF) 4 MG/ML IV SOLN
4.0000 mg | Freq: Once | INTRAVENOUS | Status: AC
  Administered 2015-12-11: 4 mg via INTRAVENOUS
  Filled 2015-12-11: qty 1

## 2015-12-11 MED ORDER — MIDAZOLAM HCL 2 MG/2ML IJ SOLN: 0.5000 mg | Freq: Once | INTRAMUSCULAR | Status: DC | PRN

## 2015-12-11 MED ORDER — PROMETHAZINE HCL 25 MG/ML IJ SOLN: 6.2500 mg | INTRAMUSCULAR | Status: DC | PRN

## 2015-12-11 MED ORDER — LIDOCAINE HCL (CARDIAC) 20 MG/ML IV SOLN
INTRAVENOUS | Status: AC
  Filled 2015-12-11: qty 5

## 2015-12-11 SURGICAL SUPPLY — 36 items
BNDG GAUZE ELAST 4 BULKY (GAUZE/BANDAGES/DRESSINGS) ×2 IMPLANT
CANISTER SUCTION 2500CC (MISCELLANEOUS) ×3 IMPLANT
COVER MAYO STAND STRL (DRAPES) ×2 IMPLANT
COVER SURGICAL LIGHT HANDLE (MISCELLANEOUS) ×3 IMPLANT
DRAPE UTILITY XL STRL (DRAPES) ×6 IMPLANT
DRSG PAD ABDOMINAL 8X10 ST (GAUZE/BANDAGES/DRESSINGS) ×3 IMPLANT
ELECT CAUTERY BLADE 6.4 (BLADE) IMPLANT
ELECT REM PT RETURN 9FT ADLT (ELECTROSURGICAL)
ELECTRODE REM PT RTRN 9FT ADLT (ELECTROSURGICAL) IMPLANT
GAUZE PACKING IODOFORM 1 (PACKING) IMPLANT
GAUZE SPONGE 4X4 12PLY STRL (GAUZE/BANDAGES/DRESSINGS) ×3 IMPLANT
GAUZE SPONGE 4X4 16PLY XRAY LF (GAUZE/BANDAGES/DRESSINGS) ×3 IMPLANT
GLOVE BIO SURGEON STRL SZ 6.5 (GLOVE) ×1 IMPLANT
GLOVE BIO SURGEON STRL SZ7.5 (GLOVE) ×3 IMPLANT
GLOVE BIO SURGEONS STRL SZ 6.5 (GLOVE) ×1
GLOVE BIOGEL PI IND STRL 6.5 (GLOVE) IMPLANT
GLOVE BIOGEL PI INDICATOR 6.5 (GLOVE) ×6
GOWN STRL REUS W/ TWL LRG LVL3 (GOWN DISPOSABLE) ×2 IMPLANT
GOWN STRL REUS W/TWL LRG LVL3 (GOWN DISPOSABLE) ×6
KIT BASIN OR (CUSTOM PROCEDURE TRAY) ×3 IMPLANT
KIT ROOM TURNOVER OR (KITS) ×3 IMPLANT
NS IRRIG 1000ML POUR BTL (IV SOLUTION) ×3 IMPLANT
PACK LITHOTOMY IV (CUSTOM PROCEDURE TRAY) ×3 IMPLANT
PAD ABD 8X10 STRL (GAUZE/BANDAGES/DRESSINGS) ×2 IMPLANT
PAD ARMBOARD 7.5X6 YLW CONV (MISCELLANEOUS) ×6 IMPLANT
PENCIL BUTTON HOLSTER BLD 10FT (ELECTRODE) IMPLANT
SPONGE LAP 18X18 X RAY DECT (DISPOSABLE) ×2 IMPLANT
SWAB COLLECTION DEVICE MRSA (MISCELLANEOUS) ×3 IMPLANT
TOWEL OR 17X24 6PK STRL BLUE (TOWEL DISPOSABLE) ×3 IMPLANT
TOWEL OR 17X26 10 PK STRL BLUE (TOWEL DISPOSABLE) ×3 IMPLANT
TUBE ANAEROBIC SPECIMEN COL (MISCELLANEOUS) ×3 IMPLANT
TUBE CONNECTING 12'X1/4 (SUCTIONS) ×1
TUBE CONNECTING 12X1/4 (SUCTIONS) ×2 IMPLANT
UNDERPAD 30X30 INCONTINENT (UNDERPADS AND DIAPERS) ×3 IMPLANT
WATER STERILE IRR 1000ML POUR (IV SOLUTION) ×3 IMPLANT
YANKAUER SUCT BULB TIP NO VENT (SUCTIONS) ×3 IMPLANT

## 2015-12-11 NOTE — Anesthesia Postprocedure Evaluation (Signed)
Anesthesia Post Note  Patient: Malvin JohnsBradford A Heffler  Procedure(s) Performed: Procedure(s) (LRB): IRRIGATION AND DEBRIDEMENT PERINEAL ABSCESS (Left)  Patient location during evaluation: PACU Anesthesia Type: General Level of consciousness: awake and alert, oriented and patient cooperative Pain management: pain level controlled Vital Signs Assessment: post-procedure vital signs reviewed and stable Respiratory status: spontaneous breathing, nonlabored ventilation and respiratory function stable Cardiovascular status: blood pressure returned to baseline and stable Postop Assessment: no signs of nausea or vomiting Anesthetic complications: no    Last Vitals:  Filed Vitals:   12/11/15 1515 12/11/15 1543  BP: 137/90 128/80  Pulse: 75 75  Temp:  37.1 C  Resp: 17 18    Last Pain:  Filed Vitals:   12/11/15 1544  PainSc: 4                  Casyn Becvar,E. Slayde Brault

## 2015-12-11 NOTE — ED Provider Notes (Signed)
CSN: [id number]     Arrival date & time 12/11/15  0730 History   First MD Initiated Contact with Patient 12/11/15 [phone]     Chief Complaint  Patient presents with  . Abscess     (Consider location/radiation/quality/duration/timing/severity/associated sxs/prior Treatment) Patient is a 39 y.o. male presenting with abscess.  Abscess Location:  Ano-genital Ano-genital abscess location:  Perineum Abscess quality: induration and painful   Pain details:    Quality:  Throbbing   Duration:  5 days   Timing:  Constant Chronicity:  Recurrent Context: not diabetes   Relieved by:  Nothing Worsened by:  Nothing tried Ineffective treatments: clindamycin. Associated symptoms: no fatigue, no fever, no headaches, no nausea and no vomiting   Risk factors: prior abscess     Past Medical History  Diagnosis Date  . Abscess    History reviewed. No pertinent past surgical history. History reviewed. No pertinent family history. Social History  Substance Use Topics  . Smoking status: Former Smoker    Types: Cigarettes  . Smokeless tobacco: None  . Alcohol Use: Yes     Comment: occ    Review of Systems  Constitutional: Negative for fever and fatigue.  HENT: Negative for sore throat.   Eyes: Negative for visual disturbance.  Respiratory: Negative for shortness of breath.   Cardiovascular: Negative for chest pain.  Gastrointestinal: Negative for nausea, vomiting and abdominal pain.  Genitourinary: Positive for difficulty urinating (pain secondary to swelling).  Musculoskeletal: Negative for back pain and neck stiffness.  Skin: Negative for rash.  Neurological: Negative for syncope and headaches.      Allergies  Doxycycline and Septra  Home Medications   Prior to Admission medications   Medication Sig Start Date End Date Taking? Authorizing Provider  clindamycin (CLEOCIN) 150 MG capsule Take 2 capsules (300 mg total) by mouth every 6 (six) hours. 08/10/15  Yes Renne Crigler, PA-C   HYDROcodone-acetaminophen (NORCO/VICODIN) 5-325 MG per tablet Take 1-2 tablets every 6 hours as needed for severe pain 08/10/15  Yes Renne Crigler, PA-C  ibuprofen (ADVIL,MOTRIN) 600 MG tablet Take 1 tablet (600 mg total) by mouth every 6 (six) hours as needed. 02/10/15  Yes Derwood Kaplan, MD  amoxicillin-clavulanate (AUGMENTIN) 875-125 MG per tablet Take 1 tablet by mouth 2 (two) times daily. Patient not taking: Reported on 12/11/2015 07/07/15   Rolland Porter, MD  amoxicillin-clavulanate (AUGMENTIN) 875-125 MG tablet Take 1 tablet by mouth 2 (two) times daily. 12/12/15   Nonie Hoyer, PA-C  HYDROcodone-acetaminophen (NORCO/VICODIN) 5-325 MG tablet Take 1-2 tablets by mouth every 4 (four) hours as needed for moderate pain. 12/12/15   Megan N Baird, PA-C   BP 130/80 mmHg  Pulse 67  Temp(Src) 98.6 F (37 C) (Oral)  Resp 16  Ht  (1.727 m)  Wt 200 lb 13.4 oz (91.1 kg)  BMI 30.54 kg/m2  SpO2 99% Physical Exam  Constitutional: He is oriented to person, place, and time. He appears well-developed and well-nourished. No distress.  HENT:  Head: Normocephalic and atraumatic.  Eyes: Conjunctivae and EOM are normal.  Neck: Normal range of motion.  Cardiovascular: Normal rate, regular rhythm, normal heart sounds and intact distal pulses.  Exam reveals no gallop and no friction rub.   No murmur heard. Pulmonary/Chest: Effort normal and breath sounds normal. No respiratory distress. He has no wheezes. He has no rales.  Abdominal: Soft. He exhibits no distension. There is no tenderness. There is no guarding.  Genitourinary:  12cm area of induration involving posterior  left scrotum and perineum with extension towards right perineum   Musculoskeletal: He exhibits no edema.  Neurological: He is alert and oriented to person, place, and time.  Skin: Skin is warm and dry. He is not diaphoretic.  Nursing note and vitals reviewed.   ED Course  Procedures (including critical care time) Labs  Review Labs Reviewed  CBC WITH DIFFERENTIAL/PLATELET - Abnormal; Notable for the following:    WBC 15.3 (*)    Neutro Abs 12.5 (*)    Monocytes Absolute 1.1 (*)    All other components within normal limits  COMPREHENSIVE METABOLIC PANEL - Abnormal; Notable for the following:    Glucose, Bld 104 (*)    Creatinine, Ser 1.30 (*)    ALT 14 (*)    All other components within normal limits  URINALYSIS, ROUTINE W REFLEX MICROSCOPIC (NOT AT [facility]) - Abnormal; Notable for the following:    Ketones, ur 15 (*)    All other components within normal limits  BASIC METABOLIC PANEL - Abnormal; Notable for the following:    BUN <5 (*)    Calcium 8.8 (*)    All other components within normal limits  CBC - Abnormal; Notable for the following:    WBC 11.4 (*)    Hemoglobin 12.3 (*)    HCT 37.0 (*)    All other components within normal limits  URINE CULTURE  GRAM STAIN  ANAEROBIC CULTURE  CULTURE, ROUTINE-ABSCESS    Imaging Review Ct Pelvis W Contrast  12/11/2015  CLINICAL DATA:  Pain and swelling in the perirectal region for 5 days, initial encounter EXAM: CT PELVIS WITH CONTRAST TECHNIQUE: Multidetector CT imaging of the pelvis was performed using the standard protocol following the bolus administration of intravenous contrast. CONTRAST:  OMNIPAQUE IOHEXOL 300 MG/ML  SOLN COMPARISON:  07/07/2015 FINDINGS: The bladder and appendix are within normal limits. Some lymph nodes are noted within the inguinal region bilaterally particularly on the left and likely reactive in nature. Similar changes are noted in the external iliac chain predominately on the left. No periaortic or pericaval adenopathy is seen. The largest of these measures 16 mm in short axis with significant surrounding inflammatory change. In the perineum predominately eccentric to the left, there is inflammatory change consistent with the patient's given clinical history of subcutaneous abscess. An area of fluid attenuation is identified  within this region which measures approximately 2.1 x 0.5 cm. The inflammatory change extends into the base of the scrotum on the left. Small hydroceles are noted. No definitive. No perirectal abnormality is seen. The osseous structures are within normal limits. IMPRESSION: Changes consistent with inflammatory change in the perineum eccentric to the left as described. Some fluid component is noted within consistent with a subcutaneous abscess. These inflammatory changes extend into the base of the scrotum. Reactive lymphadenopathy particularly on the left is noted within the inguinal and external iliac chains. Electronically Signed   By: Alcide Clever M.D.   On: 12/11/2015 10:06   I have personally reviewed and evaluated these images and lab results as part of my medical decision-making.   EKG Interpretation None      MDM   Final diagnoses:  Perineal abscess   39 year old male with history of prior scrotal perineal abscess presents with concern for abscess of the left scrotum and perineum. Patient was seen in the emergency department in August and had incision and drainage and reports improvement of his symptoms after that. Reports over the last 5 days he again developed  induration and saw his PCP started him on antibiotics and referred him to general surgery. Patient reports the pain is significantly increasing and he can no longer wait for the appointment. On exam patient has a large area of induration involving scrotum and perineum and to better delineate extension of abscess and structures involved, a CT pelvis with contrast was ordered.    Prior to CT,, patient's abscess began to drain large amount of purulent fluid.  CT shows no involvement of deeper urologic structures. Patient reexamined with continued large amount of induration to perineum and given concern of size and sensitive location consulted general surgery for evaluation. General Surgery took patient to the OR for continued drainage  and will admit for IV abx.   Alvira MondayErin Ocean Kearley, MD 12/12/15 1257

## 2015-12-11 NOTE — Transfer of Care (Signed)
Immediate Anesthesia Transfer of Care Note  Patient: Logan Rodriguez  Procedure(s) Performed: Procedure(s): IRRIGATION AND DEBRIDEMENT PERINEAL ABSCESS (Left)  Patient Location: PACU  Anesthesia Type:General  Level of Consciousness: awake, alert  and oriented  Airway & Oxygen Therapy: Patient Spontanous Breathing  Post-op Assessment: Report given to RN, Post -op Vital signs reviewed and stable and Patient moving all extremities X 4  Post vital signs: Reviewed and stable  Last Vitals:  Filed Vitals:   12/11/15 1130 12/11/15 1200  BP: 134/82 133/88  Pulse: 73 73  Temp:    Resp: 18 16    Complications: No apparent anesthesia complications

## 2015-12-11 NOTE — ED Notes (Addendum)
Pt to be evaluated of left testicular pain and swelling started last Friday. Pt has hx of recurrent perineal abscess and was to go to PCP for surgeon referral but pain is unbearable at present. Patient has been taking abx and vicodan as prescribed with no relief.   Pt was already on abx for tx of pneumonia.

## 2015-12-11 NOTE — Anesthesia Preprocedure Evaluation (Addendum)
Anesthesia Evaluation  Patient identified by MRN, date of birth, ID band Patient awake    Reviewed: Allergy & Precautions, NPO status , Patient's Chart, lab work & pertinent test results  History of Anesthesia Complications Negative for: history of anesthetic complications  Airway Mallampati: II  TM Distance: >3 FB Neck ROM: Full    Dental  (+) Teeth Intact, Dental Advisory Given   Pulmonary former smoker,    breath sounds clear to auscultation       Cardiovascular negative cardio ROS   Rhythm:Regular Rate:Normal     Neuro/Psych negative neurological ROS     GI/Hepatic negative GI ROS, (+)     substance abuse  marijuana use,   Endo/Other  Morbid obesity  Renal/GU negative Renal ROS     Musculoskeletal   Abdominal (+) + obese,   Peds  Hematology negative hematology ROS (+)   Anesthesia Other Findings   Reproductive/Obstetrics                           Anesthesia Physical Anesthesia Plan  ASA: II  Anesthesia Plan: General   Post-op Pain Management:    Induction: Intravenous  Airway Management Planned: LMA  Additional Equipment:   Intra-op Plan:   Post-operative Plan:   Informed Consent: I have reviewed the patients History and Physical, chart, labs and discussed the procedure including the risks, benefits and alternatives for the proposed anesthesia with the patient or authorized representative who has indicated his/her understanding and acceptance.   Dental advisory given  Plan Discussed with: CRNA and Surgeon  Anesthesia Plan Comments: (Plan routine monitors, GA- LMA OK)        Anesthesia Quick Evaluation

## 2015-12-11 NOTE — Progress Notes (Addendum)
ANTIBIOTIC CONSULT NOTE - INITIAL  Pharmacy Consult for Vancomycin Indication: Perineal Abscess  Allergies  Allergen Reactions  . Doxycycline Swelling  . Septra [Sulfamethoxazole-Trimethoprim] Swelling    Patient Measurements:     Vital Signs: Temp: 99.4 F (37.4 C) (12/21 0922) Temp Source: Oral (12/21 0922) BP: 133/88 mmHg (12/21 1200) Pulse Rate: 73 (12/21 1200) Intake/Output from previous day:   Intake/Output from this shift: Total I/O In: 1000 [I.V.:1000] Out: 600 [Urine:600]  Labs:  Recent Labs  12/11/15 0807  WBC 15.3*  HGB 13.4  PLT 313  CREATININE 1.30*   CrCl cannot be calculated (Unknown ideal weight.). No results for input(s): VANCOTROUGH, VANCOPEAK, VANCORANDOM, GENTTROUGH, GENTPEAK, GENTRANDOM, TOBRATROUGH, TOBRAPEAK, TOBRARND, AMIKACINPEAK, AMIKACINTROU, AMIKACIN in the last 72 hours.   Microbiology: No results found for this or any previous visit (from the past 720 hour(s)).  Medical History: Past Medical History  Diagnosis Date  . Abscess     Medications:  Scheduled:  . [MAR Hold] piperacillin-tazobactam (ZOSYN)  IV  3.375 g Intravenous 3 times per day   Infusions:  . lactated ringers     PRN: 0.9 % irrigation (POUR BTL), [MAR Hold]  morphine injection, [MAR Hold] ondansetron **OR** [MAR Hold] ondansetron (ZOFRAN) IV Assessment: 38 YOM presenting with perineal abscess on the left.  Pharmacy consulted to dose vancomycin.  Scr 1.3, CrCl~97 ml/min, WBC 15.3, T 99.4  Vanc 12/21 >>  Goal of Therapy:  Vancomycin trough level 15-20 mcg/ml  Plan:  Vancomycin 1000 mg Q8h Measure antibiotic drug levels at steady state Follow up culture results, change in renal fxn, s/sx worsening infection   Rosy Estabrook E Soul Hackman 12/11/2015,1:16 PM

## 2015-12-11 NOTE — Progress Notes (Signed)
CRITICAL VALUE ALERT  Critical value received:  Gram stain abscess; WBC few poly and mono; rare Gram pos for cocci pairs  Date of notification:  12/11/15   Time of notification:  1600  Critical value read back:Yes.    Nurse who received alert:  Leota Jacobsenara Haylin Camilli    MD notified (1st page):  Carolynne Edouardoth  Time of first page:  1630  MD notified (2nd page):  Time of second page:  Responding MD:  Carolynne Edouardoth  Time MD responded:  1640

## 2015-12-11 NOTE — Op Note (Signed)
12/11/2015  2:26 PM  PATIENT:  Logan Rodriguez  39 y.o. male  PRE-OPERATIVE DIAGNOSIS:  Left perineal abscess  POST-OPERATIVE DIAGNOSIS:  Left perineal abscess  PROCEDURE:  Procedure(s): Incision and drainage PERINEAL ABSCESS (Left)  SURGEON:  Surgeon(s) and Role:    * Griselda MinerPaul Toth III, MD - Primary  PHYSICIAN ASSISTANT:   ASSISTANTS: none   ANESTHESIA:   general  EBL:  Total I/O In: 1500 [I.V.:1500] Out: 620 [Urine:600; Blood:20]  BLOOD ADMINISTERED:none  DRAINS: none   LOCAL MEDICATIONS USED:  NONE  SPECIMEN:  No Specimen  DISPOSITION OF SPECIMEN:  N/A  COUNTS:  YES  TOURNIQUET:  * No tourniquets in log *  DICTATION: .Dragon Dictation   After informed consent was obtained the patient was brought to the operating room and placed in the supine position on the operating table. After adequate induction of general anesthesia the patient was moved in the lithotomy position and all pressure points are padded. His perineal and perirectal area were prepped with Betadine and draped in usual sterile manner. The patient had an opening at the base of the scrotum on the left. This was probed with a hemostat and then abscess cavity was identified extending superiorly along the lateral aspect of the scrotum. The abscess cavity was opened sharply with the electrocautery. A small amount of purulent material was evacuated. Cultures were obtained. The granulation tissue was fulgurated with the cautery until the area was clean and hemostatic. A second small area was identified at the base of the right scrotum and this area was also opened bluntly with a hemostat and then fulgurated with the cautery until the wound was clean and hemostatic. The wound on the right was significantly smaller than the wound on the left. Both wounds were packed with moistened Kerlix gauze and sterile dressings were applied. The patient tolerated the procedure well. At the end of the case all needle sponge counts  were correct. The patient was then awakened and taken to recovery in stable condition.  PLAN OF CARE: Admit to inpatient   PATIENT DISPOSITION:  PACU - hemodynamically stable.   Delay start of Pharmacological VTE agent (>24hrs) due to surgical blood loss or risk of bleeding: no

## 2015-12-11 NOTE — ED Notes (Signed)
Patient transported to CT 

## 2015-12-11 NOTE — Anesthesia Procedure Notes (Signed)
Procedure Name: LMA Insertion Date/Time: 12/11/2015 1:55 PM Performed by: Rise PatienceBELL, Marelin Tat T Pre-anesthesia Checklist: Patient identified, Emergency Drugs available, Suction available and Patient being monitored Patient Re-evaluated:Patient Re-evaluated prior to inductionOxygen Delivery Method: Circle system utilized Preoxygenation: Pre-oxygenation with 100% oxygen Intubation Type: IV induction LMA: LMA inserted LMA Size: 5.0 Number of attempts: 1 Placement Confirmation: positive ETCO2 and breath sounds checked- equal and bilateral Tube secured with: Tape Dental Injury: Teeth and Oropharynx as per pre-operative assessment

## 2015-12-11 NOTE — H&P (Signed)
Logan Rodriguez is an 39 y.o. male.   PCP:  Tula Nakayama  Chief Complaint: Perineal abscess on the left  HPI: Pt was seen last week 12/06/15 with a new abscess in the right axilla, and the left groin.  They did and I&D of the axilla, and placed him on clindamycin. The axilla is doing well and healed, but the left groin perineal area kept getting bigger, and more painful.  He was going to be referred to surgery by PCP,but it got more painful and he came to the ED here this AM.  It was baseball size here and while being evaluated it startted draining, but is still about 4 x 2 cm, indurated and extremely tender.   Work up in the ED show temp 99.4, VSS.  Creatinine is up to 1.3, WBC si 15.3.  CT scan not real impressive; Some lymph nodes are noted within the inguinal region bilaterally particularly on the left and likely reactive in nature. Similar changes are noted in the external iliac chain predominately on the left. No periaortic or pericaval adenopathy is seen. The largest of these measures 16 mm in short axis with significant surrounding inflammatory change.  In the perineum predominately eccentric to the left, there is inflammatory change consistent with the patient's given clinical history of subcutaneous abscess. An area of fluid attenuation is identified within this region which measures approximately 2.1 x 0.5 cm. The inflammatory change extends into the base of the scrotum on the left. Small hydroceles are noted. No definitive. No perirectal abnormality is seen.   Currently the pain is much better after drainage, but he still has fluctuance in the area, it is extremely tender and I think it needs to be opened more for better drainage.  IV antibiotics.  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 Abscess     History reviewed. No pertinent past surgical history.  No family history on file. Social History:  reports that he has quit smoking. His smoking  use included Cigarettes. He does not have any smokeless tobacco history on file. He reports that he drinks alcohol. He reports that he uses illicit drugs (Marijuana) about 4 times per week. ETOH:  Social Tobacco:  None Drugs:  Marijuana daily On disability for TBI 2002  Allergies:  Allergies  Allergen Reactions  . Doxycycline Swelling  . Septra [Sulfamethoxazole-Trimethoprim] Swelling    Prior to Admission medications   Medication Sig Start Date End Date Taking? Authorizing Provider  clindamycin (CLEOCIN) 150 MG capsule Take 2 capsules (300 mg total) by mouth every 6 (six) hours. 08/10/15  Yes Carlisle Cater, PA-C  HYDROcodone-acetaminophen (NORCO/VICODIN) 5-325 MG per tablet Take 1-2 tablets every 6 hours as needed for severe pain 08/10/15  Yes Carlisle Cater, PA-C  ibuprofen (ADVIL,MOTRIN) 600 MG tablet Take 1 tablet (600 mg total) by mouth every 6 (six) hours as needed. 02/10/15  Yes Varney Biles, MD  amoxicillin-clavulanate (AUGMENTIN) 875-125 MG per tablet Take 1 tablet by mouth 2 (two) times daily. Patient not taking: Reported on 12/11/2015 07/07/15   Tanna Furry, MD     Results for orders placed or performed during the hospital encounter of 12/11/15 (from the past 48 hour(s))  CBC with Differential     Status: Abnormal   Collection Time: 12/11/15  8:07 AM  Result Value Ref Range   WBC 15.3 (H) 4.0 - 10.5 K/uL   RBC 4.52 4.22 - 5.81 MIL/uL   Hemoglobin 13.4 13.0 -  17.0 g/dL   HCT 39.4 39.0 - 52.0 %   MCV 87.2 78.0 - 100.0 fL   MCH 29.6 26.0 - 34.0 pg   MCHC 34.0 30.0 - 36.0 g/dL   RDW 14.5 11.5 - 15.5 %   Platelets 313 150 - 400 K/uL   Neutrophils Relative % 82 %   Neutro Abs 12.5 (H) 1.7 - 7.7 K/uL   Lymphocytes Relative 11 %   Lymphs Abs 1.7 0.7 - 4.0 K/uL   Monocytes Relative 7 %   Monocytes Absolute 1.1 (H) 0.1 - 1.0 K/uL   Eosinophils Relative 0 %   Eosinophils Absolute 0.0 0.0 - 0.7 K/uL   Basophils Relative 0 %   Basophils Absolute 0.0 0.0 - 0.1 K/uL    Comprehensive metabolic panel     Status: Abnormal   Collection Time: 12/11/15  8:07 AM  Result Value Ref Range   Sodium 137 135 - 145 mmol/L   Potassium 4.0 3.5 - 5.1 mmol/L   Chloride 102 101 - 111 mmol/L   CO2 25 22 - 32 mmol/L   Glucose, Bld 104 (H) 65 - 99 mg/dL   BUN 6 6 - 20 mg/dL   Creatinine, Ser 1.30 (H) 0.61 - 1.24 mg/dL   Calcium 9.3 8.9 - 10.3 mg/dL   Total Protein 7.4 6.5 - 8.1 g/dL   Albumin 3.8 3.5 - 5.0 g/dL   AST 17 15 - 41 U/L   ALT 14 (L) 17 - 63 U/L   Alkaline Phosphatase 62 38 - 126 U/L   Total Bilirubin 1.0 0.3 - 1.2 mg/dL   GFR calc non Af Amer >60 >60 mL/min   GFR calc Af Amer >60 >60 mL/min    Comment: (NOTE) The eGFR has been calculated using the CKD EPI equation. This calculation has not been validated in all clinical situations. eGFR's persistently <60 mL/min signify possible Chronic Kidney Disease.    Anion gap 10 5 - 15  Urinalysis, Routine w reflex microscopic (not at [facility])     Status: Abnormal   Collection Time: 12/11/15 10:05 AM  Result Value Ref Range   Color, Urine YELLOW YELLOW   APPearance CLEAR CLEAR   Specific Gravity, Urine 1.010 1.005 - 1.030   pH 7.5 5.0 - 8.0   Glucose, UA NEGATIVE NEGATIVE mg/dL   Hgb urine dipstick NEGATIVE NEGATIVE   Bilirubin Urine NEGATIVE NEGATIVE   Ketones, ur 15 (A) NEGATIVE mg/dL   Protein, ur NEGATIVE NEGATIVE mg/dL   Nitrite NEGATIVE NEGATIVE   Leukocytes, UA NEGATIVE NEGATIVE    Comment: MICROSCOPIC NOT DONE ON URINES WITH NEGATIVE PROTEIN, BLOOD, LEUKOCYTES, NITRITE, OR GLUCOSE <1000 mg/dL.   Ct Pelvis W Contrast  12/11/2015  CLINICAL DATA:  Pain and swelling in the perirectal region for 5 days, initial encounter EXAM: CT PELVIS WITH CONTRAST TECHNIQUE: Multidetector CT imaging of the pelvis was performed using the standard protocol following the bolus administration of intravenous contrast. CONTRAST:  114m OMNIPAQUE IOHEXOL 300 MG/ML  SOLN COMPARISON:  07/07/2015 FINDINGS: The bladder and  appendix are within normal limits. Some lymph nodes are noted within the inguinal region bilaterally particularly on the left and likely reactive in nature. Similar changes are noted in the external iliac chain predominately on the left. No periaortic or pericaval adenopathy is seen. The largest of these measures 16 mm in short axis with significant surrounding inflammatory change. In the perineum predominately eccentric to the left, there is inflammatory change consistent with the patient's given clinical history of subcutaneous  abscess. An area of fluid attenuation is identified within this region which measures approximately 2.1 x 0.5 cm. The inflammatory change extends into the base of the scrotum on the left. Small hydroceles are noted. No definitive. No perirectal abnormality is seen. The osseous structures are within normal limits. IMPRESSION: Changes consistent with inflammatory change in the perineum eccentric to the left as described. Some fluid component is noted within consistent with a subcutaneous abscess. These inflammatory changes extend into the base of the scrotum. Reactive lymphadenopathy particularly on the left is noted within the inguinal and external iliac chains. Electronically Signed   By: Inez Catalina M.D.   On: 12/11/2015 10:06    Review of Systems  Constitutional: Negative.   HENT: Negative.   Eyes: Negative.   Respiratory: Negative.        Just finished course of antibiotics for pneumonia  Cardiovascular: Negative.   Gastrointestinal: Negative.   Genitourinary: Positive for dysuria.  Musculoskeletal: Negative.   Skin: Negative.   Neurological: Negative.   Endo/Heme/Allergies: Negative.   Psychiatric/Behavioral: Negative.     Blood pressure 134/82, pulse 73, temperature 99.4 F (37.4 C), temperature source Oral, resp. rate 18, SpO2 100 %. Physical Exam  Constitutional: He is oriented to person, place, and time. He appears well-developed and well-nourished. No  distress.  HENT:  Head: Normocephalic and atraumatic.  Nose: Nose normal.  Eyes: Conjunctivae and EOM are normal. Right eye exhibits no discharge. Left eye exhibits no discharge. No scleral icterus.  Neck: Normal range of motion. Neck supple. No JVD present. No tracheal deviation present. No thyromegaly present.  Cardiovascular: Normal rate, regular rhythm, normal heart sounds and intact distal pulses.   No murmur heard. Respiratory: Effort normal and breath sounds normal. No respiratory distress. He has no wheezes. He has no rales. He exhibits no tenderness.  GI: Soft. Bowel sounds are normal. He exhibits no distension and no mass. There is no tenderness. There is no rebound and no guarding.  Genitourinary: Penis normal. No penile tenderness.  This is just adjacent to the scrotum.  Penis and testes are not really involved but pretty tender.  Musculoskeletal: He exhibits no edema.  Lymphadenopathy:    He has no cervical adenopathy.  Neurological: He is alert and oriented to person, place, and time. No cranial nerve deficit.  Skin: Skin is warm and dry. No rash noted. No erythema. No pallor.     Psychiatric: He has a normal mood and affect. His behavior is normal. Judgment and thought content normal.     Assessment/Plan Left perineal abscess Mild renal insuffiencey  Hx of TBI  Recent pneumonia outpatient treatment  Plan:  IV fluids, antibiotics, I&D by Dr. Marlou Starks.  Haydon Kalmar 12/11/2015, 11:56 AM

## 2015-12-12 ENCOUNTER — Encounter (HOSPITAL_COMMUNITY): Payer: Self-pay | Admitting: General Surgery

## 2015-12-12 LAB — BASIC METABOLIC PANEL
Anion gap: 11 (ref 5–15)
BUN: <5 mg/dL — ABNORMAL LOW (ref 6–20)
CALCIUM: 8.8 mg/dL — AB (ref 8.9–10.3)
CO2: 24 mmol/L (ref 22–32)
CREATININE: 1.06 mg/dL (ref 0.61–1.24)
Chloride: 103 mmol/L (ref 101–111)
GFR calc non Af Amer: >60 mL/min (ref >60)
GLUCOSE: 92 mg/dL (ref 65–99)
Potassium: 3.5 mmol/L (ref 3.5–5.1)
Sodium: 138 mmol/L (ref 135–145)

## 2015-12-12 LAB — CBC
HEMATOCRIT: 37 % — AB (ref 39.0–52.0)
HEMOGLOBIN: 12.3 g/dL — AB (ref 13.0–17.0)
MCH: 29.1 pg (ref 26.0–34.0)
MCHC: 33.2 g/dL (ref 30.0–36.0)
MCV: 87.7 fL (ref 78.0–100.0)
Platelets: 304 10*3/uL (ref 150–400)
RBC: 4.22 MIL/uL (ref 4.22–5.81)
RDW: 14.3 % (ref 11.5–15.5)
WBC: 11.4 10*3/uL — ABNORMAL HIGH (ref 4.0–10.5)

## 2015-12-12 LAB — URINE CULTURE: Culture: NO GROWTH

## 2015-12-12 MED ORDER — AMOXICILLIN-POT CLAVULANATE 875-125 MG PO TABS
1.0000 | ORAL_TABLET | Freq: Two times a day (BID) | ORAL | Status: DC
  Administered 2015-12-12: 1 via ORAL
  Filled 2015-12-12: qty 1

## 2015-12-12 MED ORDER — HYDROCODONE-ACETAMINOPHEN 5-325 MG PO TABS: 1.0000 | ORAL_TABLET | ORAL | Status: DC | PRN

## 2015-12-12 MED ORDER — AMOXICILLIN-POT CLAVULANATE 875-125 MG PO TABS: 1.0000 | ORAL_TABLET | Freq: Two times a day (BID) | ORAL | Status: DC

## 2015-12-12 NOTE — Discharge Summary (Signed)
[facility] Discharge Summary   Patient ID: Logan Rodriguez MRN: [id number] DOB/AGE: January 19, 1976 39 y.o.  Admit date: 12/11/2015 Discharge date: 12/12/2015  Admitting Diagnosis: Perirectal abscess  Discharge Diagnosis Patient Active Problem List   Diagnosis Date Noted  . Perineal abscess 12/11/2015    Consultants None  Imaging: Ct Pelvis W Contrast  12/11/2015  CLINICAL DATA:  Pain and swelling in the perirectal region for 5 days, initial encounter EXAM: CT PELVIS WITH CONTRAST TECHNIQUE: Multidetector CT imaging of the pelvis was performed using the standard protocol following the bolus administration of intravenous contrast. CONTRAST:  OMNIPAQUE IOHEXOL 300 MG/ML  SOLN COMPARISON:  07/07/2015 FINDINGS: The bladder and appendix are within normal limits. Some lymph nodes are noted within the inguinal region bilaterally particularly on the left and likely reactive in nature. Similar changes are noted in the external iliac chain predominately on the left. No periaortic or pericaval adenopathy is seen. The largest of these measures 16 mm in short axis with significant surrounding inflammatory change. In the perineum predominately eccentric to the left, there is inflammatory change consistent with the patient's given clinical history of subcutaneous abscess. An area of fluid attenuation is identified within this region which measures approximately 2.1 x 0.5 cm. The inflammatory change extends into the base of the scrotum on the left. Small hydroceles are noted. No definitive. No perirectal abnormality is seen. The osseous structures are within normal limits. IMPRESSION: Changes consistent with inflammatory change in the perineum eccentric to the left as described. Some fluid component is noted within consistent with a subcutaneous abscess. These inflammatory changes extend into the base of the scrotum. Reactive lymphadenopathy particularly on the left is noted within the  inguinal and external iliac chains. Electronically Signed   By: Alcide Clever M.D.   On: 12/11/2015 10:06    Procedures Dr. Carolynne Edouard (12/11/15) - Incision and drainage of left perirectal abscesses  Hospital Course:  39 y/o AA male was seen last week 12/06/15 with a new abscess in the right axilla, and the left groin. They did and I&D of the axilla, and placed him on clindamycin. The axilla is doing well and healed, but the left groin perineal area kept getting bigger, and more painful. He was going to be referred to surgery by PCP,but it got more painful and he came to the ED here this AM. It was baseball size here and while being evaluated it startted draining, but is still about 4 x 2 cm, indurated and extremely tender.   Work up in the ED show temp 99.4, VSS. Creatinine is up to 1.3, WBC si 15.3. CT scan not real impressive; Some lymph nodes are noted within the inguinal region bilaterally particularly on the left and likely reactive in nature. Similar changes are noted in the external iliac chain predominately on the left. No periaortic or pericaval adenopathy is seen. The largest of these measures 16 mm in short axis with significant surrounding inflammatory change.  In the perineum predominately eccentric to the left, there is inflammatory change consistent with the patient's given clinical history of subcutaneous abscess. An area of fluid attenuation is identified within this region which measures approximately 2.1 x 0.5 cm. The inflammatory change extends into the base of the scrotum on the left.   Currently the pain is much better after drainage, but he still has fluctuance in the area, it is extremely tender and I think it needs to be opened more for better drainage. Patient was admitted and underwent  procedure listed above.  Tolerated procedure well and was transferred to the floor.  Diet was advanced as tolerated.  On POD #1, the patient was voiding well, tolerating diet, ambulating well,  pain well controlled, vital signs stable, wound clean and felt stable for discharge home.  Patient will follow up in our office in 1 week for wound check and knows to call with questions or concerns.  HH will be arranged for the patient to help with dressing changes.  Physical Exam: General:  Alert, NAD, pleasant, comfortable Abd:  Soft, ND, NT, good BS Perirectal:  2 Incisions clean with only serosanguinous drainage, no purulence     Medication List    TAKE these medications        amoxicillin-clavulanate 875-125 MG tablet  Commonly known as:  AUGMENTIN  Take 1 tablet by mouth 2 (two) times daily.     HYDROcodone-acetaminophen 5-325 MG tablet  Commonly known as:  NORCO/VICODIN  Take 1-2 tablets by mouth every 4 (four) hours as needed for moderate pain.     ibuprofen 600 MG tablet  Commonly known as:  ADVIL,MOTRIN  Take 1 tablet (600 mg total) by mouth every 6 (six) hours as needed.      ASK your doctor about these medications        clindamycin 150 MG capsule  Commonly known as:  CLEOCIN  Take 2 capsules (300 mg total) by mouth every 6 (six) hours.         Follow-up Information    [facility] On 12/25/2015.   Specialty:  General Surgery   Why:  For post-operation check/wound check. Your appointment is at 12:00pm, please arrive at least 30min before your appointment to complete your check in paperwork.  If you are unable to arrive 30min prior to your appointment time we may have to reschedule   Contact information:   [address] [place] [id number] [phone]      Signed: Bradd CanaryMegan N. Anaiyah Anglemyer, PA-C [facility] [phone]  12/12/2015, 1:58 PM

## 2015-12-12 NOTE — Discharge Instructions (Signed)
Your appointment is at 12:00pm, please arrive at least before your appointment to complete your check in paperwork.  If you are unable to arrive prior to your appointment time we may have to cancel or reschedule you.  Perirectal Abscess An abscess is an infected area that contains a collection of pus. A perirectal abscess is an abscess that is near the opening of the anus or around the rectum. A perirectal abscess can cause a lot of pain, especially during bowel movements. CAUSES This condition is almost always caused by an infection that starts in an anal gland. RISK FACTORS This condition is more likely to develop in:  People with diabetes or inflammatory bowel disease.  People whose body defense system (immune system) is weak.  People who have anal sex.  People who have a sexually transmitted disease (STD).  People who have certain kinds of cancers, such as rectal carcinoma, leukemia, or lymphoma. SYMPTOMS The main symptom of this condition is pain. The pain may be a throbbing pain that gets worse during bowel movements. Other symptoms include:  Fever.  Swelling.  Redness.  Bleeding.  Constipation. DIAGNOSIS The condition is diagnosed with a physical exam. If the abscess is not visible, a health care provider may need to place a finger inside the rectum to find the abscess. Sometimes, imaging tests are done to determine the size and location of the abscess. These tests may include:  An ultrasound.  An MRI.  A CT scan. TREATMENT This condition is usually treated with incision and drainage surgery. Incision and drainage surgery involves making an incision over the abscess to drain the pus. Treatment may also involve antibiotic medicine, pain medicine, stool softeners, or laxatives. HOME CARE INSTRUCTIONS  Take medicines only as directed by your health care provider.  If you were prescribed an antibiotic, finish all of it even if you start to feel better.  To  relieve pain, try sitting:  In a warm, shallow bath (sitz bath).  On a heating pad with the setting on low.  On an inflatable donut-shaped cushion.  Follow any diet instructions as directed by your health care provider.  Keep all follow-up visits as directed by your health care provider. This is important. SEEK MEDICAL CARE IF:  Your abscess is bleeding.  You have pain, swelling, or redness that is getting worse.  You are constipated.  You feel ill.  You have muscle aches or chills.  You have a fever.  Your symptoms return after the abscess has healed.   This information is not intended to replace advice given to you by your health care provider. Make sure you discuss any questions you have with your health care provider.   Document Released: 12/04/2000 Document Revised: 08/28/2015 Document Reviewed: 10/17/2014 Elsevier Interactive Patient Education 2016 Elsevier Inc.   Dressing Change A dressing is a material placed over wounds. It keeps the wound clean, dry, and protected from further injury. This provides an environment that favors wound healing.  BEFORE YOU BEGIN  Get your supplies together. Things you may need include:  Saline solution.  Flexible gauze dressing.  Medicated cream.  Tape.  Gloves.  Abdominal dressing pads.  Gauze squares.  Plastic bags.  Take pain medicine 30 minutes before the dressing change if you need it.  Take a shower before you do the first dressing change of the day. Use plastic wrap or a plastic bag to prevent the dressing from getting wet. REMOVING YOUR OLD DRESSING   Wash your  hands with soap and water. Dry your hands with a clean towel.  Put on your gloves.  Remove any tape.  Carefully remove the old dressing. If the dressing sticks, you may dampen it with warm water to loosen it, or follow your caregiver's specific directions.  Remove any gauze or packing tape that is in your wound.  Take off your gloves.  Put  the gloves, tape, gauze, or any packing tape into a plastic bag. CHANGING YOUR DRESSING  Open the supplies.  Take the cap off the saline solution.  Open the gauze package so that the gauze remains on the inside of the package.  Put on your gloves.  Clean your wound as told by your caregiver.  If you have been told to keep your wound dry, follow those instructions.  Your caregiver may tell you to do one or more of the following:  Pick up the gauze. Pour the saline solution over the gauze. Squeeze out the extra saline solution.  Put medicated cream or other medicine on your wound if you have been told to do so.  Put the solution soaked gauze only in your wound, not on the skin around it.  Pack your wound loosely or as told by your caregiver.  Put dry gauze on your wound.  Put abdominal dressing pads over the dry gauze if your wet gauze soaks through.  Tape the abdominal dressing pads in place so they will not fall off. Do not wrap the tape completely around the affected part (arm, leg, abdomen).  Wrap the dressing pads with a flexible gauze dressing to secure it in place.  Take off your gloves. Put them in the plastic bag with the old dressing. Tie the bag shut and throw it away.  Keep the dressing clean and dry until your next dressing change.  Wash your hands. SEEK MEDICAL CARE IF:  Your skin around the wound looks red.  Your wound feels more tender or sore.  You see pus in the wound.  Your wound smells bad.  You have a fever.  Your skin around the wound has a rash that itches and burns.  You see black or yellow skin in your wound that was not there before.  You feel nauseous, throw up, and feel very tired.   This information is not intended to replace advice given to you by your health care provider. Make sure you discuss any questions you have with your health care provider.   Document Released: 01/14/2005 Document Revised: 02/29/2012 Document Reviewed:  10/19/2011 Elsevier Interactive Patient Education 2016 [employer]   [facility] [place]  [address], [address], [place], [place] [address] ?  MAIN: [phone] ? TOLL FREE: [phone] ?  FAX [phone]  [email or web address]

## 2015-12-12 NOTE — Care Management Note (Addendum)
Case Management Note  Patient Details  Name: Logan Rodriguez MRN: [id number] Date of Birth: 1976-05-28  Subjective/Objective:    Pt s/p I&D perineal abscess.  PTA, pt independent, lives alone.  Pt will need HHRN for wound care at dc.                  [facility]: [facility]; start of care 24-48h post dc date.  Pt states he may have his mother to assist with dressing changes, but more likely will figure out how to do dressing changes himself.  [facility] aware pt lives alone.    Expected Discharge Date:   12/12/15               Expected Discharge Plan:  Home w Home Health Services  In-House Referral:     Discharge planning Services  CM Consult  Post Acute Care Choice:  Home Health Choice offered to:  Patient  DME Arranged:    DME Agency:     HH Arranged:  RN HH Agency:  [facility]  Status of Service:  Completed, signed off  Medicare Important Message Given:    Date Medicare IM Given:    Medicare IM give by:    Date Additional Medicare IM Given:    Additional Medicare Important Message give by:     If discussed at Long Length of Stay Meetings, dates discussed:    Additional Comments: Pt states he may have difficulty with getting rx filled due to cost.  [facility] as [facility] has [facility].  Pt states he may be able to ask someone to help him out until he gets paid.  Good Rx coupon given to pt, which will bring cost of Augmentin down to about $20.    Quintella BatonJulie W. Clydie Dillen, RN, BSN  Trauma/Neuro ICU Case Manager [phone]

## 2015-12-12 NOTE — Progress Notes (Signed)
[facility] Spruil to be D/C'd  per MD order. Discussed with the patient and all questions fully answered.  VSS, Skin clean, dry and intact without evidence of skin break down, no evidence of skin tears noted.  IV catheter discontinued intact. Site without signs and symptoms of complications. Dressing and pressure applied.  Reviewed the dressing change with patient.  An After Visit Summary was printed and given to the patient. Patient received prescription.  D/c education completed with patient/family including follow up instructions, medication list, d/c activities limitations if indicated, with other d/c instructions as indicated by MD - patient able to verbalize understanding, all questions fully answered.   Patient instructed to return to ED, call 911, or call MD for any changes in condition.   Patient to be escorted via WC, and D/C home via private auto.

## 2015-12-12 NOTE — Progress Notes (Signed)
[facility] Progress Note 1 Day Post-Op  Subjective: Patient still very sore on th bottom.  Wound okay.  Changed to Augmentin.  Objective: Vital signs in last 24 hours: Temp:  [98 F (36.7 C)-99.2 F (37.3 C)] 98.6 F (37 C) (12/22 0944) Pulse Rate:  [67-102] 67 (12/22 0944) Resp:  [13-21] 16 (12/22 0944) BP: (125-167)/(74-102) 130/80 mmHg (12/22 0944) SpO2:  [91 %-100 %] 99 % (12/22 0944) Weight:  [91.1 kg (200 lb 13.4 oz)] 91.1 kg (200 lb 13.4 oz) (12/21 1543)    Intake/Output from previous day: 12/21 0701 - 12/22 0700 In: 2070 [P.O.:570; I.V.:1500] Out: 620 [Urine:600; Blood:20] Intake/Output this shift: Total I/O In: 340 [P.O.:340] Out: 400 [Urine:400]  General: No acute distress  Lungs: Clear  Abd: Benign  Extremities: No changes  Neuro: Intact  GU:  Lots of induration posteriorly with partially packed wound.  Needs redressing.    Lab Results:  (wbc:2,hgb:2,hct:2,plt:2) BMET ) Recent Labs  12/11/15 0807 12/12/15 0537  NA 137 138  K 4.0 3.5  CL 102 103  CO2 25 24  GLUCOSE 104* 92  BUN 6 <5*  CREATININE 1.30* 1.06  CALCIUM 9.3 8.8*   PT/INR No results for input(s): LABPROT, INR in the last 72 hours. ABG No results for input(s): PHART, HCO3 in the last 72 hours.  Invalid input(s): PCO2, PO2  Studies/Results: Ct Pelvis W Contrast  12/11/2015  CLINICAL DATA:  Pain and swelling in the perirectal region for 5 days, initial encounter EXAM: CT PELVIS WITH CONTRAST TECHNIQUE: Multidetector CT imaging of the pelvis was performed using the standard protocol following the bolus administration of intravenous contrast. CONTRAST:  OMNIPAQUE IOHEXOL 300 MG/ML  SOLN COMPARISON:  07/07/2015 FINDINGS: The bladder and appendix are within normal limits. Some lymph nodes are noted within the inguinal region bilaterally particularly on the left and likely reactive in nature. Similar changes are noted in the external iliac chain predominately on the left. No  periaortic or pericaval adenopathy is seen. The largest of these measures 16 mm in short axis with significant surrounding inflammatory change. In the perineum predominately eccentric to the left, there is inflammatory change consistent with the patient's given clinical history of subcutaneous abscess. An area of fluid attenuation is identified within this region which measures approximately 2.1 x 0.5 cm. The inflammatory change extends into the base of the scrotum on the left. Small hydroceles are noted. No definitive. No perirectal abnormality is seen. The osseous structures are within normal limits. IMPRESSION: Changes consistent with inflammatory change in the perineum eccentric to the left as described. Some fluid component is noted within consistent with a subcutaneous abscess. These inflammatory changes extend into the base of the scrotum. Reactive lymphadenopathy particularly on the left is noted within the inguinal and external iliac chains. Electronically Signed   By: Alcide Clever M.D.   On: 12/11/2015 10:06    Anti-infectives: Anti-infectives    Start     Dose/Rate Route Frequency Ordered Stop   12/12/15 1330  amoxicillin-clavulanate (AUGMENTIN) 875-125 MG per tablet 1 tablet     1 tablet Oral Every 12 hours 12/12/15 1329     12/12/15 0000  amoxicillin-clavulanate (AUGMENTIN) 875-125 MG tablet     1 tablet Oral 2 times daily 12/12/15 1135     12/11/15 1400  piperacillin-tazobactam (ZOSYN) IVPB 3.375 g  Status:  Discontinued     3.375 g 12.5 mL/hr over 240 Minutes Intravenous 3 times per day 12/11/15 1246 12/12/15 1329   12/11/15 1400  vancomycin (  VANCOCIN) IVPB 1000 mg/200 mL premix  Status:  Discontinued     1,000 mg 200 mL/hr over 60 Minutes Intravenous Every 8 hours 12/11/15 1347 12/12/15 1329      Assessment/Plan: s/p Procedure(s): IRRIGATION AND DEBRIDEMENT PERINEAL ABSCESS Discharge Can be discharged with home health care, but will need an appropriate antibiotic.  Patient  allergic to doxycycline and Septra.  I have started Augmentin.    Marta LamasJames O. Gae BonWyatt, III, MD, FACS [phone]--pager [phone]--office [facility] 12/12/2015

## 2015-12-15 LAB — CULTURE, ROUTINE-ABSCESS: CULTURE: NO GROWTH

## 2015-12-16 LAB — ANAEROBIC CULTURE

## 2015-12-29 ENCOUNTER — Encounter (HOSPITAL_COMMUNITY): Payer: Self-pay | Admitting: *Deleted

## 2015-12-29 ENCOUNTER — Emergency Department (HOSPITAL_COMMUNITY)
Admission: EM | Admit: 2015-12-29 | Discharge: 2015-12-29 | Disposition: A | Discharge status: Home / Self Care | Payer: Medicare Other | Attending: Emergency Medicine | Admitting: Emergency Medicine

## 2015-12-29 DIAGNOSIS — Z87891 Personal history of nicotine dependence: Secondary | ICD-10-CM | POA: Diagnosis not present

## 2015-12-29 DIAGNOSIS — L02214 Cutaneous abscess of groin: Secondary | ICD-10-CM | POA: Diagnosis not present

## 2015-12-29 DIAGNOSIS — Z792 Long term (current) use of antibiotics: Secondary | ICD-10-CM | POA: Insufficient documentation

## 2015-12-29 MED ORDER — LIDOCAINE-EPINEPHRINE (PF) 2 %-1:200000 IJ SOLN
10.0000 mL | Freq: Once | INTRAMUSCULAR | Status: AC
  Administered 2015-12-29: 10 mL via INTRADERMAL
  Filled 2015-12-29: qty 10

## 2015-12-29 MED ORDER — HYDROCODONE-ACETAMINOPHEN 5-325 MG PO TABS: 1.0000 | ORAL_TABLET | Freq: Four times a day (QID) | ORAL | Status: DC | PRN

## 2015-12-29 NOTE — ED Notes (Signed)
PT has a new abscess located in Lt groin. Pt had surgery at the end of DEC . for abscess on testicle . Pt reports DR  With [facility]. Pt went for follow up visit [facility] last and the groin abscess was not present.

## 2015-12-29 NOTE — ED Provider Notes (Signed)
CSN: [id number]     Arrival date & time 12/29/15  [id number] History  By signing my name below, I, Logan Rodriguez, attest that this documentation has been prepared under the direction and in the presence of Logan Piliyler Adaley Kiene, PA-C. Electronically Signed: Elon SpannerGarrett Rodriguez ED Scribe. 12/29/2015. 9:37 AM.  No chief complaint on file.  The history is provided by the patient. No language interpreter was used.   HPI Comments: Logan Rodriguez is a 40 y.o. male with no chronic conditions who presents to the Emergency Department complaining of a moderately painful, 10/10 with movement, gradually worsening abscess on the left groin onset 3 days ago.  The patient reports he had a surgical irrigation and debridement of a scrotal/perineal abscess on 12/21.  He had normal follow-up on 1/4 and began taking leftover augmentin the day of onset.  He denies dysuria, testicular pain, n/v, abdominal pain, chest tightness, SOB, fever, headache.    Past Medical History  Diagnosis Date  . Abscess    Past Surgical History  Procedure Laterality Date  . Incision and drainage perirectal abscess Left 12/11/2015    Procedure: IRRIGATION AND DEBRIDEMENT PERINEAL ABSCESS;  Surgeon: Chevis PrettyPaul Toth III, MD;  Location: [facility] OR;  Service: General;  Laterality: Left;   No family history on file. Social History  Substance Use Topics  . Smoking status: Former Smoker    Types: Cigarettes  . Smokeless tobacco: Not on file  . Alcohol Use: Yes     Comment: occ    Review of Systems  Constitutional: Negative for fever, chills, diaphoresis and fatigue.  HENT: Negative for congestion, sinus pressure, sore throat and tinnitus.   Eyes: Negative for visual disturbance.  Respiratory: Negative for cough and shortness of breath.   Cardiovascular: Negative for chest pain.  Gastrointestinal: Negative for nausea, vomiting, abdominal pain, diarrhea and constipation.  Endocrine: Negative for cold intolerance and heat intolerance.  Genitourinary: Negative for  dysuria, hematuria, penile swelling, scrotal swelling, difficulty urinating and testicular pain.  Musculoskeletal: Negative for back pain.  Skin: Positive for wound. Negative for color change.  Allergic/Immunologic: Negative for immunocompromised state.  Neurological: Negative for dizziness, syncope, weakness, numbness and headaches.   10 Systems reviewed and all are negative for acute change except as noted in the HPI.  Allergies  Doxycycline and Septra  Home Medications   Prior to Admission medications   Medication Sig Start Date End Date Taking? Authorizing Provider  amoxicillin-clavulanate (AUGMENTIN) 875-125 MG tablet Take 1 tablet by mouth 2 (two) times daily. 12/12/15   Nonie HoyerMegan N Baird, PA-C  HYDROcodone-acetaminophen (NORCO/VICODIN) 5-325 MG tablet Take 1-2 tablets by mouth every 4 (four) hours as needed for moderate pain. 12/12/15   Nonie HoyerMegan N Baird, PA-C  ibuprofen (ADVIL,MOTRIN) 600 MG tablet Take 1 tablet (600 mg total) by mouth every 6 (six) hours as needed. 02/10/15   Ankit Nanavati, MD   BP 149/79 mmHg  Pulse 88  Temp(Src) 97.7 F (36.5 C) (Oral)  Resp 18  SpO2 100%   Physical Exam  Constitutional: He is oriented to person, place, and time. He appears well-developed and well-nourished. No distress.  HENT:  Head: Normocephalic and atraumatic.  Eyes: Conjunctivae and EOM are normal. Pupils are equal, round, and reactive to light.  Neck: Neck supple. No tracheal deviation present.  Cardiovascular: Normal rate.   Pulmonary/Chest: Effort normal. No respiratory distress.  Genitourinary: Testes normal and penis normal. Right testis shows no mass, no swelling and no tenderness. Left testis shows no mass, no swelling and no  tenderness. Circumcised. No penile erythema or penile tenderness. No discharge found.  2cm surgical scar noted on L scrotum. No erythema or purulent discharge noted. Surgical site remains intact.   Musculoskeletal: Normal range of motion.  Neurological: He is  alert and oriented to person, place, and time.  Skin: Skin is warm and dry.  Two blisters that measure 1 cm and 0.5 cm with induration on L groin.  No erythema. No signs of cellulitic infection.     Psychiatric: He has a normal mood and affect. His behavior is normal.  Nursing note and vitals reviewed.  ED Course  Procedures (including critical care time) EMERGENCY DEPARTMENT US SOFT TISSUE INTERPRETATION "Study: Limited Ultrasound of the noted body part in comments below"  INDICATIONS: Soft tissue infection Multiple views of the body part are obtained with a multi-frequency linear probe  PERFORMED BY:  Myself  IMAGES ARCHIVED?: Yes  SIDE:Left  BODY PART:Lower extremity  FINDINGS: Abcess present  INTERPRETATION:  Abcess present and No cellulitis noted  COMMENT:  Abscess with distinct borders noted on L groin. Depth measured at 1.5cm and 3cm in length and 1 cm in width. Abscess appears to have not connected to previous surgical site as verified by Ultrasound. Distinct borders documented.     INCISION AND DRAINAGE Performed by: Eston Esters Consent: Verbal consent obtained. Risks and benefits: risks, benefits and alternatives were discussed Type: abscess  Body area: L groin   Anesthesia: local infiltration  Incision was made with a scalpel.  Local anesthetic: lidocaine 2% w/ epinephrine  Anesthetic total: 4 ml  Complexity: complex Blunt dissection to break up loculations  Drainage: purulent  Drainage amount: large  Packing material: None. Covered with Sterile ABD pad.    Patient tolerance: Patient tolerated the procedure well with no immediate complications.  Comment: Abscess flushed with copious amounts of saline. Clear saline present after final flush. No purulent discharge noted afterwards. Previous abscess on scrotum was kept separate with dressing and drape during procedure. After the procedure, the previous surgical site was flushed with copious amounts of  sterile saline.      DIAGNOSTIC STUDIES: Oxygen Saturation is 99% on RA, normal by my interpretation.    COORDINATION OF CARE:  10:16 AM Discussed plans to perform UTS and I&D.  Patient acknowledges and agrees with plan.    Labs Review Labs Reviewed - No data to display  Imaging Review No results found. I have personally reviewed and evaluated these images and lab results as part of my medical decision-making.   EKG Interpretation None     I have reviewed relevant imaging studies. I have reviewed the relevant previous healthcare records.I obtained HPI from historian.  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 Pt had previous scrotal abscess in Dec that he was treated for surgically. Upon examination of the abscess formation, there was no sign of cellulitic infection. Pt did not complain of fever, N/V/D, no scrotal swelling or pain. Otherwise asymptomatic. I preformed an I+D of the abscess while keeping the previous surgical site on the L scrotum separate with a drape and gauze. After flushing and bandaging the new abscess formation on left groin, I flushed the previous surgical site on the L scrotum as well. I educated the patient on proper hygiene and close follow up with general surgeon who treated scrotal abscess.     Disposition/Plan:  DC home with Clindamycin 300mg  QID as well as pain medication. Will  have him do close follow up with general surgeon for evaluation of abscess.  Additional Verbal discharge instructions given and discussed with patient.  Pt acknowledges and agrees with plan   Supervising Physician Arby Barrette, MD   MDM   Final diagnoses:  Abscess of groin, left   I personally performed the services described in this documentation, which was scribed in my presence. The recorded information has been reviewed and is accurate.   Logan Pili, PA-C 12/29/15 1819  Arby Barrette, MD 12/31/15  [phone]

## 2015-12-29 NOTE — Discharge Instructions (Signed)
Please read and follow all provided instructions.  Your diagnoses today include:  1. Abscess of groin, left    Tests performed today include:  Vital signs. See below for your results today.   Medications prescribed:   Take any prescribed medications only as directed. Take your previously prescribed Clindamycin 300mg  four times a day for 7 days.  Home care instructions:   Follow any educational materials contained in this packet  Follow-up instructions: Return to the Emergency Department in 48 hours for a recheck if your symptoms are not significantly improved.  Please follow-up with your general surgeon within the week for evaluation of previous surgical site as well as new abscess.   Return instructions:  Return to the Emergency Department if you have:  Fever  Worsening symptoms  Worsening pain  Worsening swelling  Redness of the skin that moves away from the affected area, especially if it streaks away from the affected area   Any other emergent concerns  Additional Information: If you have recurrent abscesses, try both the following. Use a Qtip to apply an over-the-counter antibiotic to the inside of your nostrils, twice a day for 5 days. Wash your body with over-the-counter Hibaclens once a day for one week and then once every two weeks. This can reduce the amount of bacterial on your skin that causes boils and lead to fewer boils. If you continue to have multiple or recurrent boils, you should see a dermatologist (skin doctor).   Your vital signs today were: BP 149/79 mmHg   Pulse 88   Temp(Src) 97.7 F (36.5 C) (Oral)   Resp 18   SpO2 100% If your blood pressure (BP) was elevated above 135/85 this visit, please have this repeated by your doctor within one month. --------------

## 2015-12-29 NOTE — ED Notes (Signed)
Declined W/C at D/C and was escorted to lobby by RN. 

## 2016-02-28 IMAGING — CT CT PELVIS W/ CM
2 of 3 series · 17 of 46 positions shown, 19 images · IV contrast (Omni 300)
Comparison: 07/07/2015

CLINICAL DATA: Pain and swelling in the perirectal region for 5
days, initial encounter

EXAM:
CT PELVIS WITH CONTRAST
TECHNIQUE: Multidetector CT imaging of the pelvis was performed using the
standard protocol following the bolus administration of intravenous
contrast.
CONTRAST:  100mL OMNIPAQUE IOHEXOL 300 MG/ML  SOLN

[Series 2: pelvis with 5.0 · axial · 0.95mm/px · z∈[+564,+879]mm · 14 of 73 slices shown, 16 images]
[im 5/73  soft-tissue]
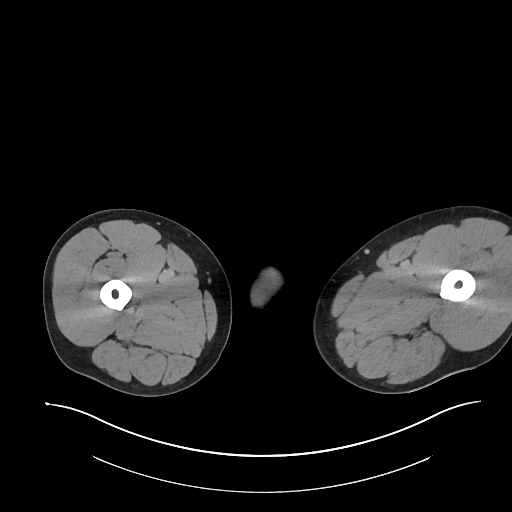
[im 5/73  bone]
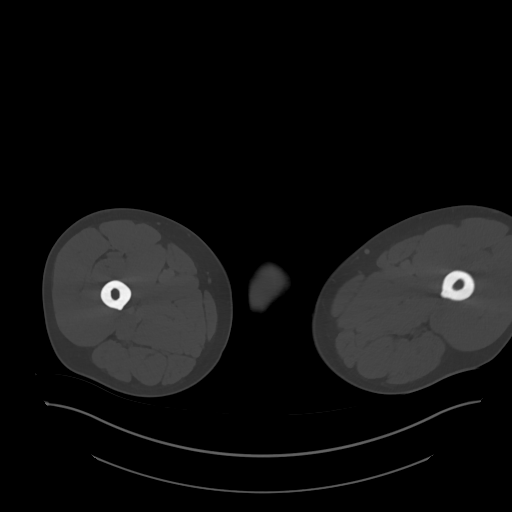
[im 10/73  soft-tissue]
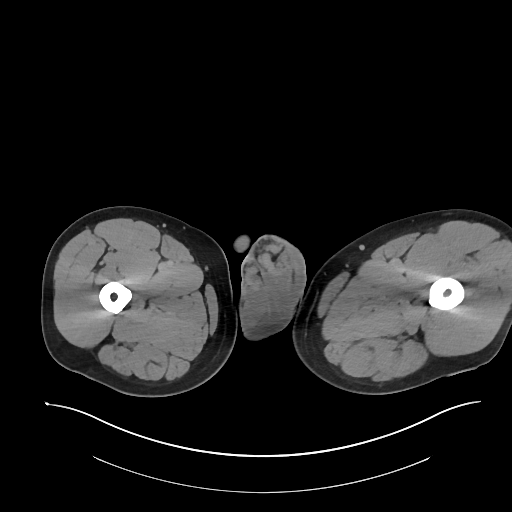
[im 14/73  soft-tissue]
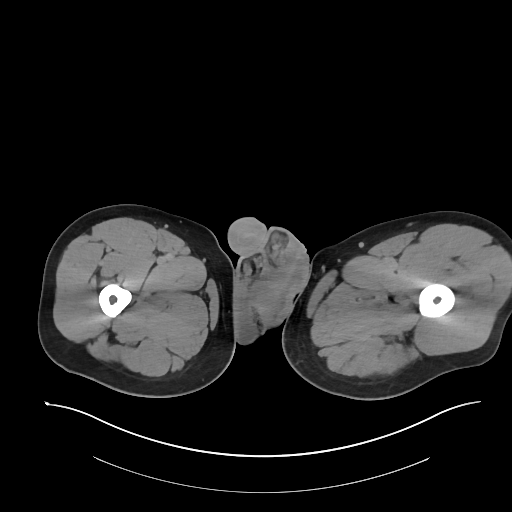
[im 19/73  soft-tissue]
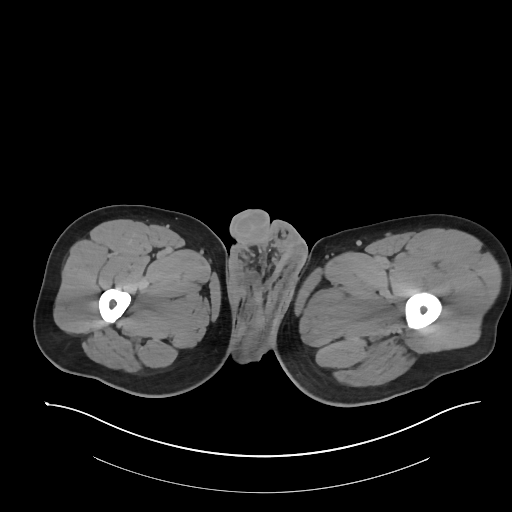
[im 24/73  soft-tissue]
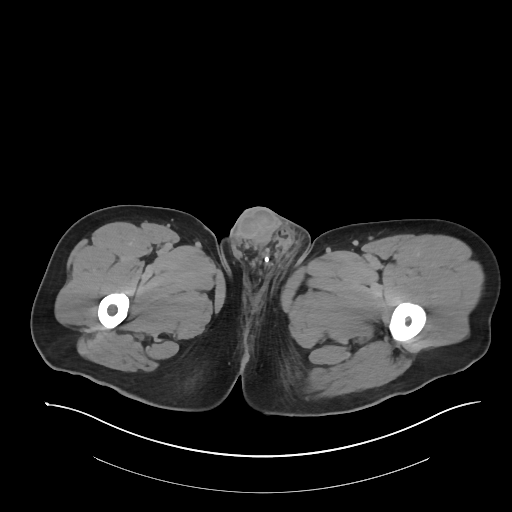
[im 28/73  soft-tissue]
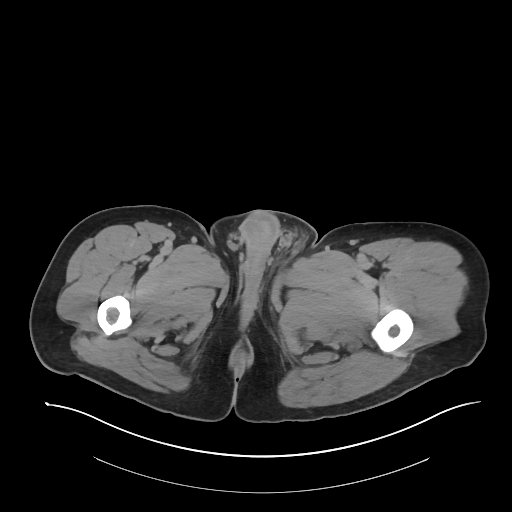
[im 33/73  soft-tissue]
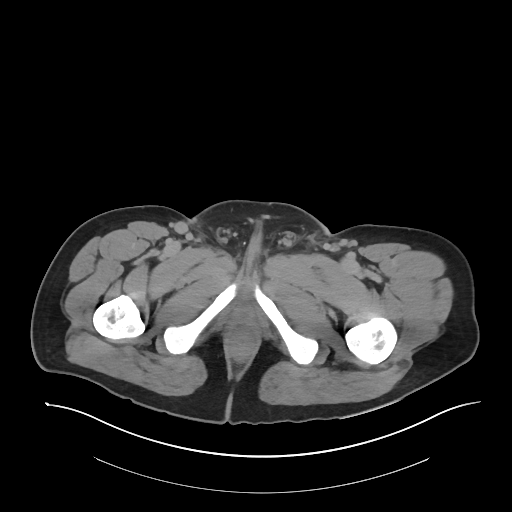
[im 40/73  soft-tissue]
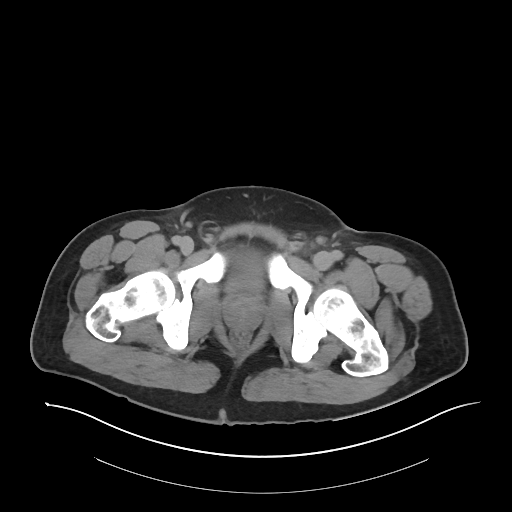
[im 45/73  soft-tissue]
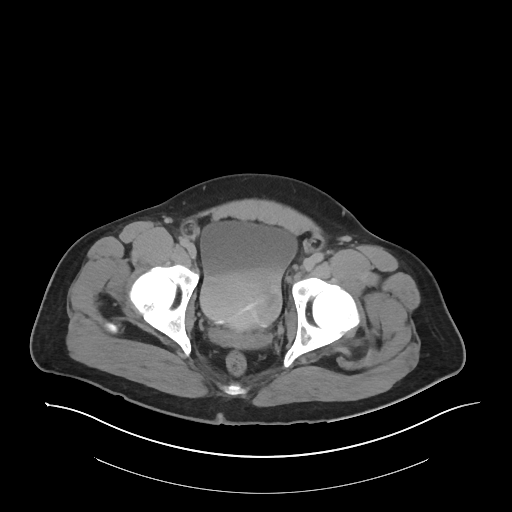
[im 45/73  bone]
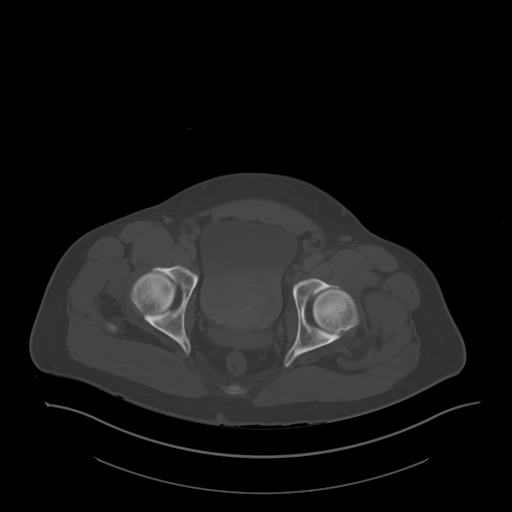
[im 49/73  soft-tissue]
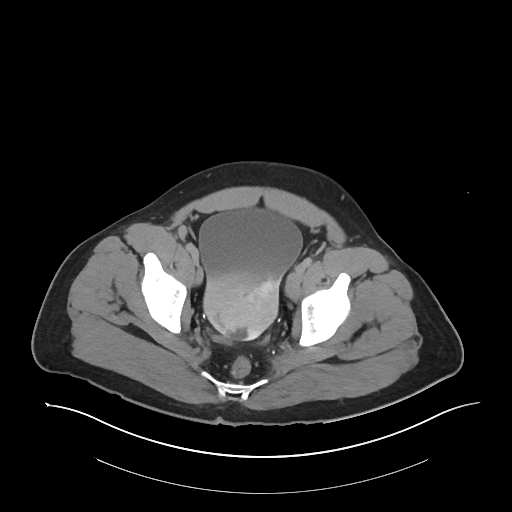
[im 54/73  soft-tissue]
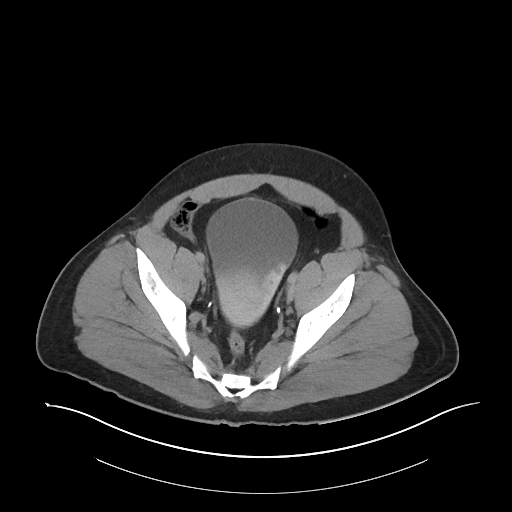
[im 59/73  soft-tissue]
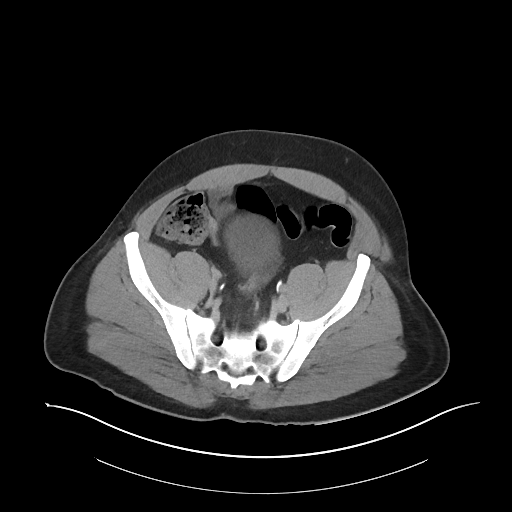
[im 63/73  soft-tissue]
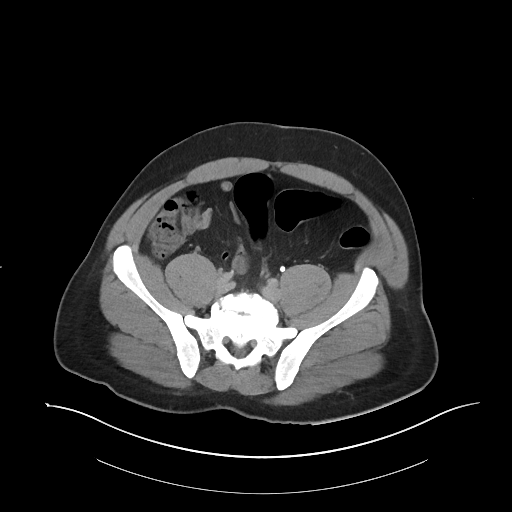
[im 68/73  soft-tissue]
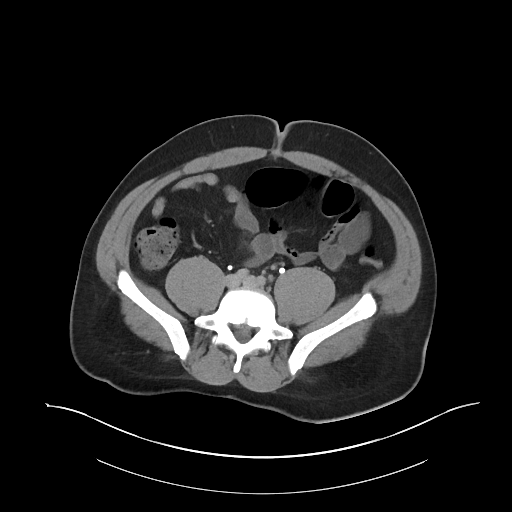

[Series 4: pelvis with 2.0 cor · coronal · 0.84mm/px · 3 of 107 slices shown]
[im 36/107  soft-tissue]
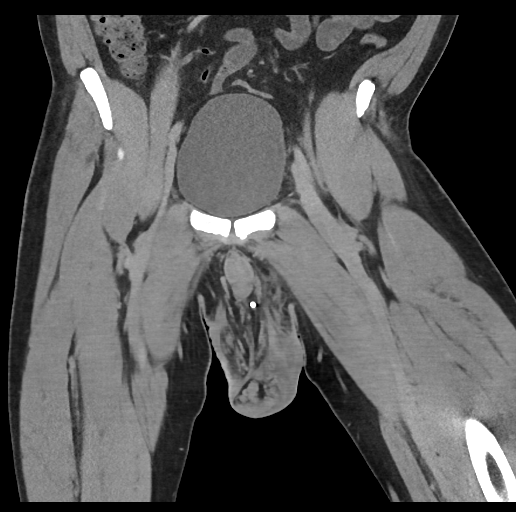
[im 48/107  soft-tissue]
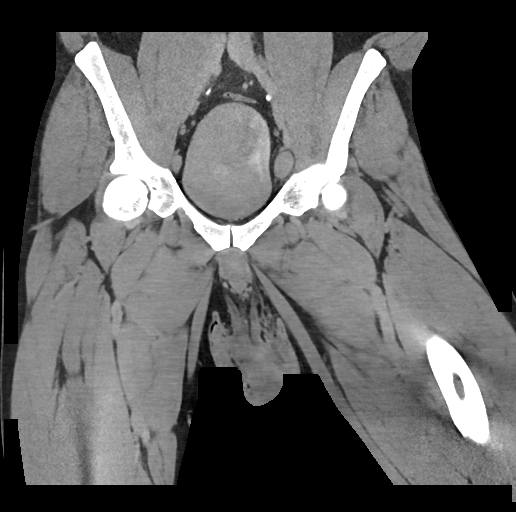
[im 59/107  soft-tissue]
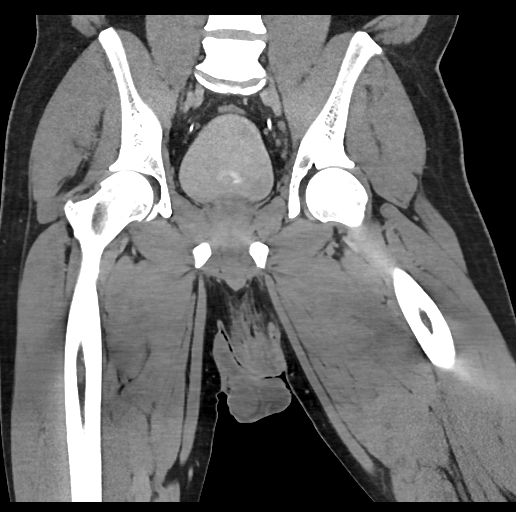

[17 of 46 positions shown; findings below may reference images not displayed]

FINDINGS: The bladder and appendix are within normal limits. Some lymph nodes
are noted within the inguinal region bilaterally particularly on the
left and likely reactive in nature. Similar changes are noted in the
external iliac chain predominately on the left. No periaortic or
pericaval adenopathy is seen. The largest of these measures 16 mm in
short axis with significant surrounding inflammatory change.

In the perineum predominately eccentric to the left, there is
inflammatory change consistent with the patient's given clinical
history of subcutaneous abscess. An area of fluid attenuation is
identified within this region which measures approximately 2.1 x
cm. The inflammatory change extends into the base of the scrotum on
the left. Small hydroceles are noted. No definitive. No perirectal
abnormality is seen. The osseous structures are within normal
limits.
IMPRESSION: Changes consistent with inflammatory change in the perineum
eccentric to the left as described. Some fluid component is noted
within consistent with a subcutaneous abscess. These inflammatory
changes extend into the base of the scrotum. Reactive
lymphadenopathy particularly on the left is noted within the
inguinal and external iliac chains.

## 2016-04-19 ENCOUNTER — Emergency Department (HOSPITAL_COMMUNITY)
Admission: EM | Admit: 2016-04-19 | Discharge: 2016-04-19 | Disposition: A | Discharge status: Home / Self Care | Payer: Medicare Other | Attending: Emergency Medicine | Admitting: Emergency Medicine

## 2016-04-19 ENCOUNTER — Encounter (HOSPITAL_COMMUNITY): Payer: Self-pay | Admitting: Emergency Medicine

## 2016-04-19 DIAGNOSIS — L02412 Cutaneous abscess of left axilla: Secondary | ICD-10-CM | POA: Diagnosis present

## 2016-04-19 DIAGNOSIS — Z792 Long term (current) use of antibiotics: Secondary | ICD-10-CM | POA: Diagnosis not present

## 2016-04-19 DIAGNOSIS — Z87891 Personal history of nicotine dependence: Secondary | ICD-10-CM | POA: Diagnosis not present

## 2016-04-19 DIAGNOSIS — L02411 Cutaneous abscess of right axilla: Secondary | ICD-10-CM | POA: Insufficient documentation

## 2016-04-19 DIAGNOSIS — L0291 Cutaneous abscess, unspecified: Secondary | ICD-10-CM

## 2016-04-19 MED ORDER — LIDOCAINE-EPINEPHRINE (PF) 2 %-1:200000 IJ SOLN
20.0000 mL | Freq: Once | INTRAMUSCULAR | Status: AC
  Administered 2016-04-19: 20 mL
  Filled 2016-04-19: qty 20

## 2016-04-19 NOTE — Discharge Instructions (Signed)
Mr. Logan Rodriguez,  Nice meeting you! Please follow-up with your primary care provider in 2 days. Return to the emergency department if you develop fevers, chills, nausea, vomiting, new/worsening symptoms. Continue taking your antibiotics. Feel better soon!  S. Lane HackerNicole Emberlynn Riggan, PA-C Abscess An abscess is an infected area that contains a collection of pus and debris.It can occur in almost any part of the body. An abscess is also known as a furuncle or boil. CAUSES  An abscess occurs when tissue gets infected. This can occur from blockage of oil or sweat glands, infection of hair follicles, or a minor injury to the skin. As the body tries to fight the infection, pus collects in the area and creates pressure under the skin. This pressure causes pain. People with weakened immune systems have difficulty fighting infections and get certain abscesses more often.  SYMPTOMS Usually an abscess develops on the skin and becomes a painful mass that is red, warm, and tender. If the abscess forms under the skin, you may feel a moveable soft area under the skin. Some abscesses break open (rupture) on their own, but most will continue to get worse without care. The infection can spread deeper into the body and eventually into the bloodstream, causing you to feel ill.  DIAGNOSIS  Your caregiver will take your medical history and perform a physical exam. A sample of fluid may also be taken from the abscess to determine what is causing your infection. TREATMENT  Your caregiver may prescribe antibiotic medicines to fight the infection. However, taking antibiotics alone usually does not cure an abscess. Your caregiver may need to make a small cut (incision) in the abscess to drain the pus. In some cases, gauze is packed into the abscess to reduce pain and to continue draining the area. HOME CARE INSTRUCTIONS   Only take over-the-counter or prescription medicines for pain, discomfort, or fever as directed by your  caregiver.  If you were prescribed antibiotics, take them as directed. Finish them even if you start to feel better.  If gauze is used, follow your caregiver's directions for changing the gauze.  To avoid spreading the infection:  Keep your draining abscess covered with a bandage.  Wash your hands well.  Do not share personal care items, towels, or whirlpools with others.  Avoid skin contact with others.  Keep your skin and clothes clean around the abscess.  Keep all follow-up appointments as directed by your caregiver. SEEK MEDICAL CARE IF:   You have increased pain, swelling, redness, fluid drainage, or bleeding.  You have muscle aches, chills, or a general ill feeling.  You have a fever. MAKE SURE YOU:   Understand these instructions.  Will watch your condition.  Will get help right away if you are not doing well or get worse.   This information is not intended to replace advice given to you by your health care provider. Make sure you discuss any questions you have with your health care provider.   Document Released: 09/16/2005 Document Revised: 06/07/2012 Document Reviewed: 02/19/2012 Elsevier Interactive Patient Education [employer]2016 [employer]

## 2016-04-19 NOTE — ED Notes (Signed)
Declined W/C at D/C and was escorted to lobby by RN. 

## 2016-04-19 NOTE — ED Notes (Signed)
Pt. Stated, I have an abscess under both arms

## 2016-04-19 NOTE — ED Provider Notes (Signed)
CSN: [id number]     Arrival date & time 04/19/16  4098 History   First MD Initiated Contact with Patient 04/19/16 [phone]     Chief Complaint  Patient presents with  . Abscess    under both arms   HPI   Logan Rodriguez is a 40 y.o. male PMH significant for perirectal abscess (I&D rectally), and axillary abscesses presenting with a 3 day history of bilateral axillary abscesses, specifically an abscess at his right armpit. He states he has seen his PCP who did not drain the abscess, and started him on bactrim. He is currently on day 3 of antibiotics and has been taking them appropriately. He states he became concerned when the abscess began to have redness around it. He denies fevers, chills, CP, SOB, abdominal pain, N/V, changes in bowel/bladder habits, IVDU, DM.   Past Medical History  Diagnosis Date  . Abscess    Past Surgical History  Procedure Laterality Date  . Incision and drainage perirectal abscess Left 12/11/2015    Procedure: IRRIGATION AND DEBRIDEMENT PERINEAL ABSCESS;  Surgeon: Chevis Pretty III, MD;  Location: [facility] OR;  Service: General;  Laterality: Left;   No family history on file. Social History  Substance Use Topics  . Smoking status: Former Smoker    Types: Cigarettes  . Smokeless tobacco: None  . Alcohol Use: Yes     Comment: occ    Review of Systems  Ten systems are reviewed and are negative for acute change except as noted in the HPI  Allergies  Doxycycline and Septra  Home Medications   Prior to Admission medications   Medication Sig Start Date End Date Taking? Authorizing Provider  amoxicillin-clavulanate (AUGMENTIN) 875-125 MG tablet Take 1 tablet by mouth 2 (two) times daily. 12/12/15   Nonie Hoyer, PA-C  HYDROcodone-acetaminophen (NORCO/VICODIN) 5-325 MG tablet Take 1-2 tablets by mouth every 6 (six) hours as needed for severe pain. 12/29/15   Audry Pili, PA-C  ibuprofen (ADVIL,MOTRIN) 600 MG tablet Take 1 tablet (600 mg total) by mouth every 6 (six)  hours as needed. 02/10/15   Ankit Nanavati, MD   BP 136/92 mmHg  Pulse 68  Temp(Src) 97.5 F (36.4 C) (Oral)  Resp 16  SpO2 98% Physical Exam  Constitutional: He appears well-developed and well-nourished. No distress.  HENT:  Head: Normocephalic and atraumatic.  Mouth/Throat: Oropharynx is clear and moist. No oropharyngeal exudate.  Eyes: Conjunctivae are normal. Pupils are equal, round, and reactive to light. Right eye exhibits no discharge. Left eye exhibits no discharge. No scleral icterus.  Neck: No tracheal deviation present.  Cardiovascular: Normal rate, regular rhythm, normal heart sounds and intact distal pulses.  Exam reveals no gallop and no friction rub.   No murmur heard. Pulmonary/Chest: Effort normal and breath sounds normal. No respiratory distress. He has no wheezes. He has no rales. He exhibits no tenderness.  Abdominal: Soft. Bowel sounds are normal. He exhibits no distension and no mass. There is no tenderness. There is no rebound and no guarding.  Musculoskeletal: He exhibits no edema.  Lymphadenopathy:    He has no cervical adenopathy.  Neurological: He is alert. Coordination normal.  Skin: Skin is warm and dry. No rash noted. He is not diaphoretic. There is erythema.  Left and right axillary scar tissue present. Left axillary with 2x2 cm fluctuant abscess with minimal surrounding erythema. Right axillary without fluctuant lesions or erythema.   Psychiatric: He has a normal mood and affect. His behavior is normal.  Nursing  note and vitals reviewed.   ED Course  .Marland Kitchen.Incision and Drainage Date/Time: 04/19/2016 8:00 AM Performed by: Althea GrimmerILEY, Beacher Every NICOLE Authorized by: Melton KrebsILEY, Camile Esters NICOLE Consent: Verbal consent obtained. Consent given by: patient Patient identity confirmed: verbally with patient Type: abscess Body area: upper extremity Anesthesia: local infiltration Local anesthetic: lidocaine 2% with epinephrine Anesthetic total: 5 ml Drainage:  purulent Drainage amount: moderate Packing material: 1/4 in iodoform gauze Patient tolerance: Patient tolerated the procedure well with no immediate complications    Final diagnoses:  Abscess   Patient with skin abscess amenable to incision and drainage.  Mild signs of cellulitis is surrounding skin.  Will d/c to home without abx, as patient is already on bactrim. Patient may be safely discharged home. Discussed reasons for return. Patient to follow-up with primary care provider within two days to recheck wound. Patient in understanding and agreement with the plan.   Melton KrebsSamantha Nicole Delainee Tramel, PA-C 04/27/16 [address]1052  Tove Wideman Nicole EmbdenRiley, [place] JerseyPA-C 04/27/16 1106  Marily MemosJason Mesner, MD 04/29/16 1010

## 2016-06-21 ENCOUNTER — Encounter (HOSPITAL_COMMUNITY): Payer: Self-pay | Admitting: Emergency Medicine

## 2016-06-21 ENCOUNTER — Emergency Department (HOSPITAL_COMMUNITY)
Admission: EM | Admit: 2016-06-21 | Discharge: 2016-06-21 | Disposition: A | Discharge status: Home / Self Care | Payer: Medicare Other | Attending: Emergency Medicine | Admitting: Emergency Medicine

## 2016-06-21 DIAGNOSIS — N5089 Other specified disorders of the male genital organs: Secondary | ICD-10-CM | POA: Diagnosis present

## 2016-06-21 DIAGNOSIS — Z87891 Personal history of nicotine dependence: Secondary | ICD-10-CM | POA: Insufficient documentation

## 2016-06-21 DIAGNOSIS — K610 Anal abscess: Secondary | ICD-10-CM | POA: Insufficient documentation

## 2016-06-21 DIAGNOSIS — L0291 Cutaneous abscess, unspecified: Secondary | ICD-10-CM

## 2016-06-21 MED ORDER — HYDROCODONE-ACETAMINOPHEN 5-325 MG PO TABS: 1.0000 | ORAL_TABLET | ORAL | Status: DC | PRN

## 2016-06-21 NOTE — ED Provider Notes (Signed)
CSN: [id number]     Arrival date & time 06/21/16  0424 History   First MD Initiated Contact with Patient 06/21/16 0500     Chief Complaint  Patient presents with  . Groin Swelling      HPI  Patient presents for evaluation of pain and swelling inferior to his right hemiscrotum. He states his multiple abscesses in the past including his axilla, inguinal area, and scrotum. His never been given a formal diagnosis of hidradenitis.  had pain and swelling for the last 4-5 days. Seen on Wednesday primary care placed on clindamycin. Area is getting more and more painful. Presents here. Able to urinate and defecate without difficulty.  Past Medical History  Diagnosis Date  . Abscess    Past Surgical History  Procedure Laterality Date  . Incision and drainage perirectal abscess Left 12/11/2015    Procedure: IRRIGATION AND DEBRIDEMENT PERINEAL ABSCESS;  Surgeon: Chevis PrettyPaul Toth III, MD;  Location: [facility] OR;  Service: General;  Laterality: Left;   No family history on file. Social History  Substance Use Topics  . Smoking status: Former Smoker    Types: Cigarettes  . Smokeless tobacco: None  . Alcohol Use: Yes     Comment: occ    Review of Systems  Constitutional: Negative for fever, chills, diaphoresis, appetite change and fatigue.  HENT: Negative for mouth sores, sore throat and trouble swallowing.   Eyes: Negative for visual disturbance.  Respiratory: Negative for cough, chest tightness, shortness of breath and wheezing.   Cardiovascular: Negative for chest pain.  Gastrointestinal: Negative for nausea, vomiting, abdominal pain, diarrhea and abdominal distention.  Endocrine: Negative for polydipsia, polyphagia and polyuria.  Genitourinary: Positive for scrotal swelling. Negative for dysuria, frequency and hematuria.  Musculoskeletal: Negative for gait problem.  Skin: Negative for color change, pallor and rash.  Neurological: Negative for dizziness, syncope, light-headedness and headaches.    Hematological: Does not bruise/bleed easily.  Psychiatric/Behavioral: Negative for behavioral problems and confusion.      Allergies  Doxycycline and Septra  Home Medications   Prior to Admission medications   Medication Sig Start Date End Date Taking? Authorizing Provider  clindamycin (CLEOCIN) 300 MG capsule Take 300 mg by mouth 3 (three) times daily.   Yes Historical Provider, MD  amoxicillin-clavulanate (AUGMENTIN) 875-125 MG tablet Take 1 tablet by mouth 2 (two) times daily. Patient not taking: Reported on 06/21/2016 12/12/15   Nonie HoyerMegan N Baird, PA-C  HYDROcodone-acetaminophen (NORCO/VICODIN) 5-325 MG tablet Take 1 tablet by mouth every 4 (four) hours as needed. 06/21/16   Rolland PorterMark Holle Sprick, MD  ibuprofen (ADVIL,MOTRIN) 600 MG tablet Take 1 tablet (600 mg total) by mouth every 6 (six) hours as needed. Patient not taking: Reported on 06/21/2016 02/10/15   Derwood KaplanAnkit Nanavati, MD   BP 165/93 mmHg  Pulse 83  Temp(Src) 97.8 F (36.6 C) (Oral)  Resp 16  Ht 5\' 8"  (1.727 m)  Wt 185 lb (83.915 kg)  BMI 28.14 kg/m2  SpO2 99% Physical Exam  Constitutional: He is oriented to person, place, and time. He appears well-developed and well-nourished. No distress.  HENT:  Head: Normocephalic.  Eyes: Conjunctivae are normal. Pupils are equal, round, and reactive to light. No scleral icterus.  Neck: Normal range of motion. Neck supple. No thyromegaly present.  Cardiovascular: Normal rate and regular rhythm.  Exam reveals no gallop and no friction rub.   No murmur heard. Pulmonary/Chest: Effort normal and breath sounds normal. No respiratory distress. He has no wheezes. He has no rales.  Abdominal: Soft.  Bowel sounds are normal. He exhibits no distension. There is no tenderness. There is no rebound.  Genitourinary:     Musculoskeletal: Normal range of motion.  Neurological: He is alert and oriented to person, place, and time.  Skin: Skin is warm and dry. No rash noted.  Psychiatric: He has a normal mood  and affect. His behavior is normal.    ED Course  Procedures (including critical care time) Labs Review Labs Reviewed - No data to display  Imaging Review No results found. I have personally reviewed and evaluated these images and lab results as part of my medical decision-making.   EKG Interpretation None      MDM   Final diagnoses:  Abscess    After incision and drainage and irrigation patient was discharged home. We'll continue soaks at home. Patient has prescriptions currently for clindamycin. Vicodin for pain.  INCISION AND DRAINAGE Performed by: Claudean KindsJAMES, Zyon Rosser JOSEPH Consent: Verbal consent obtained. Risks and benefits: risks, benefits and alternatives were discussed Type: abscess  Body area: Perineum  Anesthesia: local infiltration  Incision was made with a scalpel.  Local anesthetic: lidocaine 1% c epinephrine  Anesthetic total: 3 ml  Complexity: complex Blunt dissection to break up loculations  Drainage: purulent  Drainage amount: moderate, purulent, thick.  Irrigated with NS until Clear effluent.  Packing material: 1/4 in iodoform gauze  Patient tolerance: Patient tolerated the procedure well with no immediate complications.       Rolland PorterMark Francee Setzer, MD 06/22/16 [phone]

## 2016-06-21 NOTE — ED Notes (Signed)
Pt arrives with c/o groin swelling continuing despite antibiotics (clindamycin 300MG  tid) with no relief, states been on it since Wednesday morning for abscess and feels like the area is getting larger. States very painful.

## 2016-06-21 NOTE — Discharge Instructions (Signed)
Home, soak, and gently massage the area to encourage additional drainage.  Do this at least once per day for the next few days.   Abscess An abscess is an infected area that contains a collection of pus and debris.It can occur in almost any part of the body. An abscess is also known as a furuncle or boil. CAUSES  An abscess occurs when tissue gets infected. This can occur from blockage of oil or sweat glands, infection of hair follicles, or a minor injury to the skin. As the body tries to fight the infection, pus collects in the area and creates pressure under the skin. This pressure causes pain. People with weakened immune systems have difficulty fighting infections and get certain abscesses more often.  SYMPTOMS Usually an abscess develops on the skin and becomes a painful mass that is red, warm, and tender. If the abscess forms under the skin, you may feel a moveable soft area under the skin. Some abscesses break open (rupture) on their own, but most will continue to get worse without care. The infection can spread deeper into the body and eventually into the bloodstream, causing you to feel ill.  DIAGNOSIS  Your caregiver will take your medical history and perform a physical exam. A sample of fluid may also be taken from the abscess to determine what is causing your infection. TREATMENT  Your caregiver may prescribe antibiotic medicines to fight the infection. However, taking antibiotics alone usually does not cure an abscess. Your caregiver may need to make a small cut (incision) in the abscess to drain the pus. In some cases, gauze is packed into the abscess to reduce pain and to continue draining the area. HOME CARE INSTRUCTIONS   Only take over-the-counter or prescription medicines for pain, discomfort, or fever as directed by your caregiver.  If you were prescribed antibiotics, take them as directed. Finish them even if you start to feel better.  If gauze is used, follow your  caregiver's directions for changing the gauze.  To avoid spreading the infection:  Keep your draining abscess covered with a bandage.  Wash your hands well.  Do not share personal care items, towels, or whirlpools with others.  Avoid skin contact with others.  Keep your skin and clothes clean around the abscess.  Keep all follow-up appointments as directed by your caregiver. SEEK MEDICAL CARE IF:   You have increased pain, swelling, redness, fluid drainage, or bleeding.  You have muscle aches, chills, or a general ill feeling.  You have a fever. MAKE SURE YOU:   Understand these instructions.  Will watch your condition.  Will get help right away if you are not doing well or get worse.   This information is not intended to replace advice given to you by your health care provider. Make sure you discuss any questions you have with your health care provider.   Document Released: 09/16/2005 Document Revised: 06/07/2012 Document Reviewed: 02/19/2012 Elsevier Interactive Patient Education [employer]2016 Elsevier Inc.

## 2016-07-15 ENCOUNTER — Encounter (HOSPITAL_COMMUNITY): Payer: Self-pay | Admitting: *Deleted

## 2016-07-15 ENCOUNTER — Inpatient Hospital Stay (HOSPITAL_COMMUNITY)
Admission: AD | Admit: 2016-07-15 | Discharge: 2016-07-18 | DRG: 572 | Disposition: A | Discharge status: Home Health | Payer: Medicare Other | Source: Ambulatory Visit | Attending: General Surgery | Admitting: General Surgery

## 2016-07-15 DIAGNOSIS — Z881 Allergy status to other antibiotic agents status: Secondary | ICD-10-CM

## 2016-07-15 DIAGNOSIS — Z87891 Personal history of nicotine dependence: Secondary | ICD-10-CM

## 2016-07-15 DIAGNOSIS — L732 Hidradenitis suppurativa: Secondary | ICD-10-CM | POA: Diagnosis present

## 2016-07-15 DIAGNOSIS — Z882 Allergy status to sulfonamides status: Secondary | ICD-10-CM

## 2016-07-15 DIAGNOSIS — L02215 Cutaneous abscess of perineum: Secondary | ICD-10-CM | POA: Diagnosis present

## 2016-07-15 DIAGNOSIS — N492 Inflammatory disorders of scrotum: Secondary | ICD-10-CM | POA: Diagnosis present

## 2016-07-15 LAB — URINALYSIS, DIPSTICK ONLY
BILIRUBIN URINE: NEGATIVE
Glucose, UA: NEGATIVE mg/dL
Hgb urine dipstick: NEGATIVE
Ketones, ur: NEGATIVE mg/dL
LEUKOCYTES UA: NEGATIVE
NITRITE: NEGATIVE
Protein, ur: NEGATIVE mg/dL
SPECIFIC GRAVITY, URINE: 1.013 (ref 1.005–1.030)
pH: 6 (ref 5.0–8.0)

## 2016-07-15 LAB — BASIC METABOLIC PANEL
ANION GAP: 5 (ref 5–15)
BUN: 11 mg/dL (ref 6–20)
CALCIUM: 8.9 mg/dL (ref 8.9–10.3)
CO2: 28 mmol/L (ref 22–32)
Chloride: 107 mmol/L (ref 101–111)
Creatinine, Ser: 1.19 mg/dL (ref 0.61–1.24)
Glucose, Bld: 93 mg/dL (ref 65–99)
POTASSIUM: 3.8 mmol/L (ref 3.5–5.1)
Sodium: 140 mmol/L (ref 135–145)

## 2016-07-15 LAB — CBC
HEMATOCRIT: 36.2 % — AB (ref 39.0–52.0)
HEMOGLOBIN: 12.2 g/dL — AB (ref 13.0–17.0)
MCH: 29.3 pg (ref 26.0–34.0)
MCHC: 33.7 g/dL (ref 30.0–36.0)
MCV: 87 fL (ref 78.0–100.0)
Platelets: 234 10*3/uL (ref 150–400)
RBC: 4.16 MIL/uL — AB (ref 4.22–5.81)
RDW: 14.5 % (ref 11.5–15.5)
WBC: 10.5 10*3/uL (ref 4.0–10.5)

## 2016-07-15 MED ORDER — POTASSIUM CHLORIDE IN NACL 20-0.9 MEQ/L-% IV SOLN
INTRAVENOUS | Status: DC
  Administered 2016-07-15 – 2016-07-16 (×2): via INTRAVENOUS
  Filled 2016-07-15 (×3): qty 1000

## 2016-07-15 MED ORDER — MORPHINE SULFATE (PF) 2 MG/ML IV SOLN
2.0000 mg | INTRAVENOUS | Status: DC | PRN
  Administered 2016-07-16: 2 mg via INTRAVENOUS
  Filled 2016-07-15: qty 1

## 2016-07-15 MED ORDER — ONDANSETRON 4 MG PO TBDP: 4.0000 mg | ORAL_TABLET | Freq: Four times a day (QID) | ORAL | Status: DC | PRN

## 2016-07-15 MED ORDER — DEXTROSE 5 % IV SOLN
2.0000 g | INTRAVENOUS | Status: DC
  Administered 2016-07-15: 2 g via INTRAVENOUS
  Filled 2016-07-15 (×2): qty 2

## 2016-07-15 MED ORDER — HEPARIN SODIUM (PORCINE) 5000 UNIT/ML IJ SOLN
5000.0000 [IU] | Freq: Three times a day (TID) | INTRAMUSCULAR | Status: AC
  Administered 2016-07-15: 5000 [IU] via SUBCUTANEOUS
  Filled 2016-07-15: qty 1

## 2016-07-15 MED ORDER — ONDANSETRON HCL 4 MG/2ML IJ SOLN
4.0000 mg | Freq: Four times a day (QID) | INTRAMUSCULAR | Status: DC | PRN
  Administered 2016-07-16: 4 mg via INTRAVENOUS

## 2016-07-15 MED ORDER — HYDROCODONE-ACETAMINOPHEN 5-325 MG PO TABS
1.0000 | ORAL_TABLET | ORAL | Status: DC | PRN
  Administered 2016-07-15: 2 via ORAL
  Filled 2016-07-15: qty 2

## 2016-07-15 NOTE — H&P (Signed)
  History of Present Illness  The patient is a 40 year old male who presents with a subcutaneous abscess. Logan Rodriguez is a 40 year old patient who is known to our service. He has a history of recurrent abscesses. He presents to the office today having been seen by his primary care physician at [facility] in [facility] for a one-week history of pain and swelling in the perineal area. He was most recently seen by [name] in December 2016 when Dr. Carolynne Edouard incised and drained a perineal abscess in the operating room. He recovered well from this but unfortunately had a recurrence earlier this month. He was seen in the emergency department where incision and drainage was performed. The patient states that this had healed however over the past week or so he has had recurrent pain and swelling in the area. He was seen by his primary today and was referred to see [name]. He has taken 1 tablet of clindamycin that was prescribed today.  Past Medical History:  Diagnosis Date  . Abscess     Past Surgical History:  Procedure Laterality Date  . INCISION AND DRAINAGE PERIRECTAL ABSCESS Left 12/11/2015   Procedure: IRRIGATION AND DEBRIDEMENT PERINEAL ABSCESS;  Surgeon: Chevis Pretty III, MD;  Location: [facility] OR;  Service: General;  Laterality: Left;     Allergies  Doxycycline *DERMATOLOGICALS* Septra *ANTI-INFECTIVE AGENTS - MISC.*  Medication History  Clindamycin HCl (300MG  Capsule, 1 (one) Capsule Oral four times daily,    Vitals: BP (!) 146/103 (BP Location: Left Arm)   Pulse 77   Temp 98.3 F (36.8 C) (Oral)   Resp 16   Ht 5\' 8"  (1.727 m)   Wt 84.2 kg (185 lb 11.2 oz)   SpO2 100%   BMI 28.24 kg/m     Physical Exam  General Mental Status-Alert. General Appearance-Cooperative, Not in acute distress. Orientation-Oriented X4.  Integumentary General Characteristics Overall examination of the patient's skin reveals - no rashes. Color - normal coloration of skin. Skin Moisture - normal  skin moisture.  Head and Neck Head-normocephalic, atraumatic with no lesions or palpable masses.  Chest and Lung Exam Chest and lung exam reveals -quiet, even and easy respiratory effort with no use of accessory muscles.  Male Genitourinary Note: There is a large, fluctuant, exquisitely tender area to the right inguinal crease that involves the scrotum. It measures about 7 cm x 4 cm. No purulent drainage.   Neurologic Neurologic evaluation reveals -normal attention span and ability to concentrate and able to name objects and repeat phrases. Appropriate fund of knowledge .  Musculoskeletal Global Assessment Gait and Station - normal gait and station and normal posture.    Assessment & Plan  PERINEAL ABSCESS [phone]) Impression: Given the size and location of the abscess, he will need incision and drainage in the operating room. He will be [facility] to [facility] for definitive care.  Current Plans Admit [facility].  IV abx.   NPO for I&D perineal abscess under general anesthesia

## 2016-07-16 ENCOUNTER — Inpatient Hospital Stay (HOSPITAL_COMMUNITY): Payer: Medicare Other | Admitting: Anesthesiology

## 2016-07-16 ENCOUNTER — Encounter (HOSPITAL_COMMUNITY): Payer: Self-pay

## 2016-07-16 ENCOUNTER — Encounter (HOSPITAL_COMMUNITY): Admission: AD | Disposition: A | Discharge status: Home Health | Payer: Self-pay | Source: Ambulatory Visit

## 2016-07-16 HISTORY — PX: IRRIGATION AND DEBRIDEMENT ABSCESS: SHX5252

## 2016-07-16 LAB — SURGICAL PCR SCREEN
MRSA, PCR: NEGATIVE
Staphylococcus aureus: NEGATIVE

## 2016-07-16 SURGERY — IRRIGATION AND DEBRIDEMENT ABSCESS
Anesthesia: General | Site: Scrotum

## 2016-07-16 MED ORDER — FENTANYL CITRATE (PF) 250 MCG/5ML IJ SOLN
INTRAMUSCULAR | Status: AC
  Filled 2016-07-16: qty 5

## 2016-07-16 MED ORDER — CHLORHEXIDINE GLUCONATE CLOTH 2 % EX PADS: 6.0000 | MEDICATED_PAD | Freq: Once | CUTANEOUS | Status: DC

## 2016-07-16 MED ORDER — ONDANSETRON 4 MG PO TBDP: 4.0000 mg | ORAL_TABLET | Freq: Four times a day (QID) | ORAL | Status: DC | PRN

## 2016-07-16 MED ORDER — KCL IN DEXTROSE-NACL 20-5-0.45 MEQ/L-%-% IV SOLN
INTRAVENOUS | Status: DC
  Administered 2016-07-16 – 2016-07-17 (×2): via INTRAVENOUS
  Filled 2016-07-16 (×3): qty 1000

## 2016-07-16 MED ORDER — MORPHINE SULFATE (PF) 2 MG/ML IV SOLN
1.0000 mg | INTRAVENOUS | Status: DC | PRN
  Administered 2016-07-16: 2 mg via INTRAVENOUS
  Filled 2016-07-16: qty 1

## 2016-07-16 MED ORDER — HYDROMORPHONE HCL 1 MG/ML IJ SOLN
INTRAMUSCULAR | Status: AC
  Filled 2016-07-16: qty 1

## 2016-07-16 MED ORDER — DEXTROSE 5 % IV SOLN
2.0000 g | INTRAVENOUS | Status: DC
  Administered 2016-07-16 – 2016-07-17 (×2): 2 g via INTRAVENOUS
  Filled 2016-07-16 (×2): qty 2

## 2016-07-16 MED ORDER — ONDANSETRON HCL 4 MG/2ML IJ SOLN: 4.0000 mg | Freq: Four times a day (QID) | INTRAMUSCULAR | Status: DC | PRN

## 2016-07-16 MED ORDER — FENTANYL CITRATE (PF) 100 MCG/2ML IJ SOLN
INTRAMUSCULAR | Status: DC | PRN
  Administered 2016-07-16 (×3): 50 ug via INTRAVENOUS

## 2016-07-16 MED ORDER — HYDROMORPHONE HCL 1 MG/ML IJ SOLN
1.0000 mg | INTRAMUSCULAR | Status: DC | PRN
  Administered 2016-07-17 – 2016-07-18 (×3): 1 mg via INTRAVENOUS
  Filled 2016-07-16 (×3): qty 1

## 2016-07-16 MED ORDER — LACTATED RINGERS IV SOLN
INTRAVENOUS | Status: DC
  Administered 2016-07-16: 1000 mL via INTRAVENOUS
  Administered 2016-07-16: 12:00:00 via INTRAVENOUS

## 2016-07-16 MED ORDER — LIDOCAINE HCL (CARDIAC) 20 MG/ML IV SOLN
INTRAVENOUS | Status: AC
  Filled 2016-07-16: qty 5

## 2016-07-16 MED ORDER — CHLORHEXIDINE GLUCONATE CLOTH 2 % EX PADS
6.0000 | MEDICATED_PAD | Freq: Once | CUTANEOUS | Status: AC
  Administered 2016-07-16: 6 via TOPICAL

## 2016-07-16 MED ORDER — 0.9 % SODIUM CHLORIDE (POUR BTL) OPTIME
TOPICAL | Status: DC | PRN
  Administered 2016-07-16: 1000 mL

## 2016-07-16 MED ORDER — EPHEDRINE SULFATE 50 MG/ML IJ SOLN
INTRAMUSCULAR | Status: DC | PRN
  Administered 2016-07-16: 10 mg via INTRAVENOUS

## 2016-07-16 MED ORDER — ACETAMINOPHEN 650 MG RE SUPP: 650.0000 mg | Freq: Four times a day (QID) | RECTAL | Status: DC | PRN

## 2016-07-16 MED ORDER — PROPOFOL 10 MG/ML IV BOLUS
INTRAVENOUS | Status: AC
  Filled 2016-07-16: qty 20

## 2016-07-16 MED ORDER — ACETAMINOPHEN 325 MG PO TABS: 650.0000 mg | ORAL_TABLET | Freq: Four times a day (QID) | ORAL | Status: DC | PRN

## 2016-07-16 MED ORDER — MIDAZOLAM HCL 2 MG/2ML IJ SOLN
INTRAMUSCULAR | Status: AC
  Filled 2016-07-16: qty 2

## 2016-07-16 MED ORDER — HYDROMORPHONE HCL 1 MG/ML IJ SOLN
0.2500 mg | INTRAMUSCULAR | Status: DC | PRN
  Administered 2016-07-16 (×4): 0.5 mg via INTRAVENOUS

## 2016-07-16 MED ORDER — HYDROCODONE-ACETAMINOPHEN 5-325 MG PO TABS
1.0000 | ORAL_TABLET | ORAL | Status: DC | PRN
  Administered 2016-07-16 – 2016-07-17 (×5): 2 via ORAL
  Filled 2016-07-16 (×5): qty 2

## 2016-07-16 MED ORDER — ONDANSETRON HCL 4 MG/2ML IJ SOLN
INTRAMUSCULAR | Status: AC
  Filled 2016-07-16: qty 2

## 2016-07-16 MED ORDER — MIDAZOLAM HCL 5 MG/5ML IJ SOLN
INTRAMUSCULAR | Status: DC | PRN
  Administered 2016-07-16: 2 mg via INTRAVENOUS

## 2016-07-16 SURGICAL SUPPLY — 35 items
APL SKNCLS STERI-STRIP NONHPOA (GAUZE/BANDAGES/DRESSINGS)
BENZOIN TINCTURE PRP APPL 2/3 (GAUZE/BANDAGES/DRESSINGS) IMPLANT
BLADE HEX COATED 2.75 (ELECTRODE) ×3 IMPLANT
BLADE SURG SZ10 CARB STEEL (BLADE) ×3 IMPLANT
BNDG GAUZE ELAST 4 BULKY (GAUZE/BANDAGES/DRESSINGS) ×4 IMPLANT
CLOSURE WOUND 1/2 X4 (GAUZE/BANDAGES/DRESSINGS)
COVER SURGICAL LIGHT HANDLE (MISCELLANEOUS) ×3 IMPLANT
DECANTER SPIKE VIAL GLASS SM (MISCELLANEOUS) IMPLANT
DRAIN CHANNEL RND F F (WOUND CARE) IMPLANT
DRAPE LAPAROTOMY T 102X78X121 (DRAPES) IMPLANT
DRAPE LAPAROTOMY TRNSV 102X78 (DRAPE) IMPLANT
DRAPE SHEET LG 3/4 BI-LAMINATE (DRAPES) IMPLANT
ELECT REM PT RETURN 9FT ADLT (ELECTROSURGICAL) ×3
ELECTRODE REM PT RTRN 9FT ADLT (ELECTROSURGICAL) ×1 IMPLANT
EVACUATOR SILICONE 100CC (DRAIN) IMPLANT
GAUZE SPONGE 4X4 12PLY STRL (GAUZE/BANDAGES/DRESSINGS) ×3 IMPLANT
GLOVE BIOGEL PI IND STRL 7.0 (GLOVE) ×1 IMPLANT
GLOVE BIOGEL PI INDICATOR 7.0 (GLOVE) ×2
GLOVE SURG ORTHO 8.0 STRL STRW (GLOVE) ×3 IMPLANT
GOWN STRL REUS W/TWL LRG LVL3 (GOWN DISPOSABLE) ×3 IMPLANT
GOWN STRL REUS W/TWL XL LVL3 (GOWN DISPOSABLE) ×6 IMPLANT
KIT BASIN OR (CUSTOM PROCEDURE TRAY) ×3 IMPLANT
MARKER SKIN DUAL TIP RULER LAB (MISCELLANEOUS) IMPLANT
NDL HYPO 25X1 1.5 SAFETY (NEEDLE) ×1 IMPLANT
NEEDLE HYPO 25X1 1.5 SAFETY (NEEDLE) ×3 IMPLANT
NS IRRIG 1000ML POUR BTL (IV SOLUTION) ×3 IMPLANT
PACK GENERAL/GYN (CUSTOM PROCEDURE TRAY) ×3 IMPLANT
SPONGE LAP 18X18 X RAY DECT (DISPOSABLE) IMPLANT
STAPLER VISISTAT 35W (STAPLE) IMPLANT
STRIP CLOSURE SKIN 1/2X4 (GAUZE/BANDAGES/DRESSINGS) IMPLANT
SUT ETHILON 3 0 PS 1 (SUTURE) IMPLANT
SUT MNCRL AB 4-0 PS2 18 (SUTURE) IMPLANT
SUT VIC AB 3-0 SH 18 (SUTURE) IMPLANT
SYR CONTROL 10ML LL (SYRINGE) ×3 IMPLANT
TOWEL OR 17X26 10 PK STRL BLUE (TOWEL DISPOSABLE) ×3 IMPLANT

## 2016-07-16 NOTE — Anesthesia Preprocedure Evaluation (Addendum)
Anesthesia Evaluation  Patient identified by MRN, date of birth, ID band Patient awake    Reviewed: Allergy & Precautions, H&P , NPO status , Patient's Chart, lab work & pertinent test results  Airway Mallampati: I  TM Distance: >3 FB Neck ROM: Full    Dental no notable dental hx. (+) Teeth Intact, Dental Advisory Given   Pulmonary neg pulmonary ROS, former smoker,    Pulmonary exam normal breath sounds clear to auscultation       Cardiovascular negative cardio ROS   Rhythm:Regular Rate:Normal     Neuro/Psych negative neurological ROS  negative psych ROS   GI/Hepatic negative GI ROS, Neg liver ROS,   Endo/Other  negative endocrine ROS  Renal/GU negative Renal ROS  negative genitourinary   Musculoskeletal   Abdominal   Peds  Hematology negative hematology ROS (+)   Anesthesia Other Findings   Reproductive/Obstetrics negative OB ROS                            Anesthesia Physical Anesthesia Plan  ASA: I  Anesthesia Plan: General   Post-op Pain Management:    Induction: Intravenous  Airway Management Planned: LMA  Additional Equipment:   Intra-op Plan:   Post-operative Plan: Extubation in OR  Informed Consent: I have reviewed the patients History and Physical, chart, labs and discussed the procedure including the risks, benefits and alternatives for the proposed anesthesia with the patient or authorized representative who has indicated his/her understanding and acceptance.   Dental advisory given  Plan Discussed with: CRNA  Anesthesia Plan Comments:         Anesthesia Quick Evaluation

## 2016-07-16 NOTE — Brief Op Note (Signed)
07/15/2016 - 07/16/2016  1:04 PM  PATIENT:  Logan Rodriguez  40 y.o. male  PRE-OPERATIVE DIAGNOSIS:  Recurrent scrotal and perineal abcess  POST-OPERATIVE DIAGNOSIS:  same  PROCEDURE:  Procedure(s): INCISION AND DRAINAGE AND OPEN PACKING PERINAL AND SCROTAL ABSCESS (N/A)  SURGEON:  Surgeon(s) and Role:    * Darnell Level, MD - Primary  ANESTHESIA:   general  EBL:  Total I/O In: 700 [I.V.:700] Out: 410 [Urine:400; Blood:10]  BLOOD ADMINISTERED:none  DRAINS: none   LOCAL MEDICATIONS USED:  NONE  SPECIMEN:  No Specimen  DISPOSITION OF SPECIMEN:  N/A  COUNTS:  YES  TOURNIQUET:  * No tourniquets in log *  DICTATION: .Other Dictation: Dictation Number [id number]  PLAN OF CARE: Admit for overnight observation  PATIENT DISPOSITION:  PACU - hemodynamically stable.   Delay start of Pharmacological VTE agent (>24hrs) due to surgical blood loss or risk of bleeding: no

## 2016-07-16 NOTE — Anesthesia Postprocedure Evaluation (Signed)
Anesthesia Post Note  Patient: Treyden A Kalas  Procedure(s) Performed: Procedure(s) (LRB): INCISION AND DRAINAGE AND OPEN PACKING PERINAL AND SCROTAL ABSCESS (N/A)  Patient location during evaluation: PACU Anesthesia Type: General Level of consciousness: awake and alert Pain management: pain level controlled Vital Signs Assessment: post-procedure vital signs reviewed and stable Respiratory status: spontaneous breathing, nonlabored ventilation, respiratory function stable and patient connected to nasal cannula oxygen Cardiovascular status: blood pressure returned to baseline and stable Postop Assessment: no signs of nausea or vomiting Anesthetic complications: no    Last Vitals:  Vitals:   07/16/16 1330 07/16/16 1345  BP: (!) 159/87 134/74  Pulse: (!) 56 (!) 50  Resp: (!) 0 11  Temp:  36.4 C    Last Pain:  Vitals:   07/16/16 1345  TempSrc:   PainSc: 7                  Orvilla Truett,W. EDMOND

## 2016-07-16 NOTE — Progress Notes (Addendum)
Patient ID: Logan Rodriguez, male   DOB: 1976/04/22, 40 y.o.   MRN: [id number]  General Surgery [facility]  Subjective: Patient in bed, moderate pain.  Objective: Vital signs in last 24 hours: Temp:  [97.6 F (36.4 C)-98.3 F (36.8 C)] 97.6 F (36.4 C) (07/27 0619) Pulse Rate:  [63-77] 63 (07/27 0619) Resp:  [16] 16 (07/27 0619) BP: (128-149)/(78-103) 149/83 (07/27 0619) SpO2:  [100 %] 100 % (07/27 0619) Weight:  [84.2 kg (185 lb 11.2 oz)] 84.2 kg (185 lb 11.2 oz) (07/26 1730) Last BM Date: 07/15/16  Intake/Output from previous day: 07/26 0701 - 07/27 0700 In: 890 [I.V.:840; IV Piggyback:50] Out: 1000 [Urine:1000] Intake/Output this shift: No intake/output data recorded.  Physical Exam: HEENT - sclerae clear, mucous membranes moist Neck - soft GU - fluctuent collection right lateral scrotum and perineum, markedly tender, approx 6x4cm Ext - no edema, non-tender Neuro - alert & oriented, no focal deficits  Lab Results:   Recent Labs  07/15/16 2015  WBC 10.5  HGB 12.2*  HCT 36.2*  PLT 234   BMET  Recent Labs  07/15/16 2015  NA 140  K 3.8  CL 107  CO2 28  GLUCOSE 93  BUN 11  CREATININE 1.19  CALCIUM 8.9   PT/INR No results for input(s): LABPROT, INR in the last 72 hours. Comprehensive Metabolic Panel:    Component Value Date/Time   NA 140 07/15/2016 2015   NA 138 12/12/2015 0537   K 3.8 07/15/2016 2015   K 3.5 12/12/2015 0537   CL 107 07/15/2016 2015   CL 103 12/12/2015 0537   CO2 28 07/15/2016 2015   CO2 24 12/12/2015 0537   BUN 11 07/15/2016 2015   BUN <5 (L) 12/12/2015 0537   CREATININE 1.19 07/15/2016 2015   CREATININE 1.06 12/12/2015 0537   GLUCOSE 93 07/15/2016 2015   GLUCOSE 92 12/12/2015 0537   CALCIUM 8.9 07/15/2016 2015   CALCIUM 8.8 (L) 12/12/2015 0537   AST 17 12/11/2015 0807   ALT 14 (L) 12/11/2015 0807   ALKPHOS 62 12/11/2015 0807   BILITOT 1.0 12/11/2015 0807   PROT 7.4 12/11/2015 0807   ALBUMIN 3.8  12/11/2015 0807    Studies/Results: No results found.  Assessment & Plans: Recurrent perineal/scrotal abscess  Plan operative drainage/debridement/open packing this AM  Orders entered  The risks and benefits of the procedure have been discussed at length with the patient.  The patient understands the proposed procedure, potential alternative treatments, and the course of recovery to be expected.  All of the patient's questions have been answered at this time.  The patient wishes to proceed with surgery.   Velora Heckler, MD, [facility] Office: [phone]   Daneli Butkiewicz Judie Petit 07/16/2016

## 2016-07-16 NOTE — Transfer of Care (Signed)
Immediate Anesthesia Transfer of Care Note  Patient: Logan Rodriguez  Procedure(s) Performed: Procedure(s): INCISION AND DRAINAGE AND OPEN PACKING PERINAL AND SCROTAL ABSCESS (N/A)  Patient Location: PACU  Anesthesia Type:General  Level of Consciousness: awake, alert  and oriented  Airway & Oxygen Therapy: Patient Spontanous Breathing and Patient connected to face mask oxygen  Post-op Assessment: Report given to RN and Post -op Vital signs reviewed and stable  Post vital signs: Reviewed and stable  Last Vitals:  Vitals:   07/16/16 0619 07/16/16 1021  BP: (!) 149/83 (!) 197/80  Pulse: 63 (!) 59  Resp: 16 16  Temp: 36.4 C 36.6 C    Last Pain:  Vitals:   07/16/16 1117  TempSrc:   PainSc: 8       Patients Stated Pain Goal: 5 (07/16/16 1117)  Complications: No apparent anesthesia complications

## 2016-07-16 NOTE — Anesthesia Procedure Notes (Signed)
Procedure Name: LMA Insertion Performed by: Monterrius Cardosa J Pre-anesthesia Checklist: Patient identified, Emergency Drugs available, Suction available, Patient being monitored and Timeout performed Patient Re-evaluated:Patient Re-evaluated prior to inductionOxygen Delivery Method: Circle system utilized Preoxygenation: Pre-oxygenation with 100% oxygen Intubation Type: IV induction Ventilation: Mask ventilation without difficulty LMA: LMA inserted LMA Size: 4.0 Number of attempts: 1 Placement Confirmation: positive ETCO2,  CO2 detector and breath sounds checked- equal and bilateral Tube secured with: Tape Dental Injury: Teeth and Oropharynx as per pre-operative assessment        

## 2016-07-17 DIAGNOSIS — L732 Hidradenitis suppurativa: Secondary | ICD-10-CM | POA: Diagnosis present

## 2016-07-17 DIAGNOSIS — R102 Pelvic and perineal pain: Secondary | ICD-10-CM | POA: Diagnosis present

## 2016-07-17 DIAGNOSIS — Z881 Allergy status to other antibiotic agents status: Secondary | ICD-10-CM | POA: Diagnosis not present

## 2016-07-17 DIAGNOSIS — Z882 Allergy status to sulfonamides status: Secondary | ICD-10-CM | POA: Diagnosis not present

## 2016-07-17 DIAGNOSIS — Z87891 Personal history of nicotine dependence: Secondary | ICD-10-CM | POA: Diagnosis not present

## 2016-07-17 DIAGNOSIS — L02215 Cutaneous abscess of perineum: Secondary | ICD-10-CM | POA: Diagnosis present

## 2016-07-17 DIAGNOSIS — N492 Inflammatory disorders of scrotum: Secondary | ICD-10-CM | POA: Diagnosis present

## 2016-07-17 MED ORDER — ENOXAPARIN SODIUM 40 MG/0.4ML ~~LOC~~ SOLN
40.0000 mg | SUBCUTANEOUS | Status: DC
  Administered 2016-07-17: 40 mg via SUBCUTANEOUS
  Filled 2016-07-17 (×2): qty 0.4

## 2016-07-17 MED ORDER — CLINDAMYCIN HCL 300 MG PO CAPS: 300.0000 mg | ORAL_CAPSULE | Freq: Three times a day (TID) | ORAL | Status: DC

## 2016-07-17 MED ORDER — ACETAMINOPHEN 325 MG PO TABS: 650.0000 mg | ORAL_TABLET | Freq: Four times a day (QID) | ORAL | Status: DC | PRN

## 2016-07-17 MED ORDER — HYDROCODONE-ACETAMINOPHEN 5-325 MG PO TABS: 1.0000 | ORAL_TABLET | ORAL | 0 refills | Status: DC | PRN

## 2016-07-17 NOTE — Care Management Obs Status (Signed)
MEDICARE OBSERVATION STATUS NOTIFICATION   Patient Details  Name: Logan Rodriguez MRN: [id number] Date of Birth: [birth year]   Medicare Observation Status Notification Given:  Yes    Alexis Goodell, RN 07/17/2016, 10:37 AM

## 2016-07-17 NOTE — Progress Notes (Signed)
ANTICOAGULATION CONSULT NOTE - Initial Consult  Pharmacy Consult for Lovenox Indication: DVT  Allergies  Allergen Reactions  . Doxycycline Swelling  . Septra [Sulfamethoxazole-Trimethoprim] Swelling    Patient Measurements: Height: 5\' 8"  (172.7 cm) Weight: 185 lb 11.2 oz (84.2 kg) IBW/kg (Calculated) : 68.4  Vital Signs: Temp: 98 F (36.7 C) (07/28 0619) Temp Source: Oral (07/28 0619) BP: 144/75 (07/28 0619) Pulse Rate: 70 (07/28 0619)  Labs:  Recent Labs  07/15/16 2015  HGB 12.2*  HCT 36.2*  PLT 234  CREATININE 1.19    Estimated Creatinine Clearance: 86.3 mL/min (by C-G formula based on SCr of 1.19 mg/dL).   Medical History: Past Medical History:  Diagnosis Date  . Abscess     Medications:  Scheduled:  . cefTRIAXone (ROCEPHIN)  IV  2 g Intravenous Q24H   Infusions:  . dextrose 5 % and 0.45 % NaCl with KCl 20 mEq/L 50 mL/hr at 07/17/16 3762    Assessment: 40 yo male presented with recurrent perineal abscess now s/p incision, drainage, and open packing of abscess on 7/27 to start Lovenox for DVT prophylaxis per Md orders to start 24 hrs after surgery which AET listed as 7/27 at 1314. Labs OK and renal function stable.    Goal of Therapy:  Heparin level 0.3-0.6 units/ml Monitor platelets by anticoagulation protocol: Yes   Plan:  1) Lovenox 40mg  SQ daily to start this afternoon 2) Will sign off as labs appear stable   Hessie Knows, PharmD, BCPS Pager [phone] 07/17/2016 9:56 AM

## 2016-07-17 NOTE — Op Note (Signed)
Logan Rodriguez, Logan Rodriguez            ACCOUNT NO.:  [id number]  MEDICAL RECORD NO.:  [id number]  LOCATION:  1527                         FACILITY:  [facility]  PHYSICIAN:  Velora Heckler, MD      DATE OF BIRTH:  Aug 22, 1976  DATE OF PROCEDURE:  07/16/2016                              OPERATIVE REPORT   PREOPERATIVE DIAGNOSIS:  Recurrent abscess.  Scrotum and perineum.  POSTOPERATIVE DIAGNOSIS:  Recurrent abscess.  Scrotum and perineum.  PROCEDURE:  Incision, drainage, and open packing of abscess, right scrotum and perineum.  SURGEON:  Velora Heckler, MD, FACS  ANESTHESIA:  General.  ESTIMATED BLOOD LOSS:  Minimal.  PREPARATION:  Betadine.  COMPLICATIONS:  None.  INDICATIONS:  The patient is a 40 year old male referred from the General Surgery Office to the hospital for management of recurrent scrotal and perineal abscess.  The patient has a history of hidradenitis and recurrent abscesses.  He had undergone operative drainage of an abscess in this location in December 2016.  The patient was admitted to the hospital and started on intravenous antibiotics.  He was prepared and brought to the operating room this morning for incision and drainage.  BODY OF REPORT:  Procedure was done in OR #2 at the [facility].  The patient was brought to the operating room, and placed in supine position on the operating room table.  Following administration of general anesthesia, the patient was positioned in lithotomy and prepped and draped in the usual aseptic fashion.  After ascertaining that an adequate level of anesthesia had been achieved, the fluctuant mass in the lateral right scrotum and perineum is incised.  An ellipse of skin including sinus tracts and chronic granulation tissue is excised inferiorly.  Dissection was carried into the abscess cavity and the abscess was unroofed extending the incision anteriorly along the right side of the scrotum.  The entire wound  measures 9 cm in length x 4 cm in width x 4 cm in depth.  Purulent fluid was evacuated.  Granulation tissue was debrided. Hemostasis was achieved with the electrocautery.  Wound was then packed with Betadine-soaked Kerlix gauze wet to dry.  The patient was awakened from anesthesia and brought to the recovery room.  The patient tolerated the procedure well.   Velora Heckler, MD, [facility] Office: [phone]    TMG/MEDQ  D:  07/16/2016  T:  07/16/2016  Job:  [id number]

## 2016-07-17 NOTE — Care Management CC44 (Signed)
Condition Code 44 Documentation Completed  Patient Details  Name: Logan Rodriguez MRN: [id number] Date of Birth: 1976-08-26   Condition Code 44 given:    Patient signature on Condition Code 44 notice:    Documentation of 2 MD's agreement:    Code 44 added to claim:       Alexis Goodell, RN 07/17/2016, 10:37 AM

## 2016-07-17 NOTE — Progress Notes (Signed)
1 Day Post-Op  Subjective: Her looks good, we are going to let him get in the shower and take dressing down wet and begin dressing changes.  Dressing is out and site looks fine.  It is easier to pack from the front.  Starting wet to dry dressing and PRN dressings.  He can shower and get soap and water into the site.  Objective: Vital signs in last 24 hours: Temp:  [97.4 F (36.3 C)-98.3 F (36.8 C)] 98 F (36.7 C) (07/28 0619) Pulse Rate:  [50-86] 70 (07/28 0619) Resp:  [0-17] 16 (07/28 0619) BP: (123-197)/(74-93) 144/75 (07/28 0619) SpO2:  [99 %-100 %] 100 % (07/28 0619) Last BM Date: 07/15/16 960 PO Urine 3100 Afebrile, VSS NO labs/films    Intake/Output from previous day: 07/27 0701 - 07/28 0700 In: 2378.3 [P.O.:960; I.V.:1418.3] Out: 3110 [Urine:3100; Blood:10] Intake/Output this shift: No intake/output data recorded.  General appearance: alert, cooperative and no distress Male genitalia: normal, Open site right groin beside the scrotum. It is clean and looks good, repacked with 2 wet 4 x 4's.    Lab Results:   Recent Labs  07/15/16 2015  WBC 10.5  HGB 12.2*  HCT 36.2*  PLT 234    BMET  Recent Labs  07/15/16 2015  NA 140  K 3.8  CL 107  CO2 28  GLUCOSE 93  BUN 11  CREATININE 1.19  CALCIUM 8.9   PT/INR No results for input(s): LABPROT, INR in the last 72 hours.  No results for input(s): AST, ALT, ALKPHOS, BILITOT, PROT, ALBUMIN in the last 168 hours.   Lipase  No results found for: LIPASE   Studies/Results: No results found. Prior to Admission medications   Medication Sig Start Date End Date Taking? Authorizing Provider  clindamycin (CLEOCIN) 300 MG capsule Take 300 mg by mouth 3 (three) times daily.   Yes Historical Provider, MD  ibuprofen (ADVIL,MOTRIN) 200 MG tablet Take 600 mg by mouth every 6 (six) hours as needed for headache, mild pain or moderate pain.   Yes Historical Provider, MD  amoxicillin-clavulanate (AUGMENTIN) 875-125 MG tablet  Take 1 tablet by mouth 2 (two) times daily. Patient not taking: Reported on 06/21/2016 12/12/15   Nonie Hoyer, PA-C  HYDROcodone-acetaminophen (NORCO/VICODIN) 5-325 MG tablet Take 1 tablet by mouth every 4 (four) hours as needed. Patient not taking: Reported on 07/15/2016 06/21/16   Rolland Porter, MD  ibuprofen (ADVIL,MOTRIN) 600 MG tablet Take 1 tablet (600 mg total) by mouth every 6 (six) hours as needed. Patient not taking: Reported on 06/21/2016 02/10/15   Derwood Kaplan, MD    Medications: . cefTRIAXone (ROCEPHIN)  IV  2 g Intravenous Q24H   . dextrose 5 % and 0.45 % NaCl with KCl 20 mEq/L 50 mL/hr at 07/17/16 1937    Assessment/Plan Recurrent perineal abscess S/p Incision, drainage, and open packing of abscess, right scrotum and perineum.07/16/16, Dr. Darnell Level Starting wet to dry dressings, family to be instructed and Home health arranged to help.  He can shower PRN and allow soap and water into site. FEN:  IV fluids/regular diet ID:  Ceftriaxone day 3 - will check on d/c antibiotics DVT:  Start Lovenox today/SCD  Plan:  Dressing changes and instruction of family. Home tomorrow with family and home health to assist.  Follow up in the [facility].  He was on Cleocin, I will let him finish course as prescribed earlier.    LOS: 1 day    Annalaura Sauseda 07/17/2016 [phone]

## 2016-07-17 NOTE — Discharge Instructions (Signed)
Dressing Change twice a day and as needed for soiling. A dressing is a material placed over wounds. It keeps the wound clean, dry, and protected from further injury.  BEFORE YOU BEGIN  Get your supplies together. Things you may need include:  Salt solution (saline).  Sterile gauze.  Tape.  Gloves.  Belly (abdominal) pads. Or lady's pad to protect clothing.  Gauze squares.  Plastic bags.  Take pain medicine 30 minutes before the bandage change if you need it.  Take a shower before you do the first bandage change of the day. Put plastic wrap or a bag over the dressing. REMOVING YOUR OLD BANDAGE  Wash your hands with soap and water. Dry your hands with a clean towel.  Put on your gloves.  Remove any tape.  Remove the old bandage as told. If it sticks, put a small amount of warm water on it to loosen the bandage.  Remove any gauze or packing tape in your wound.  Take off your gloves.  Put the gloves, tape, gauze, or any packing tape in a plastic bag. CHANGING YOUR BANDAGE  Open the supplies.  Take the cap off the salt solution.  Open the gauze. Leave the gauze on the inside of the package.  Put on your gloves.  Clean your wound as told by your doctor.  Keep your wound dry if your doctor told you to do so.  Your doctor may tell you to do one or more of the following:  Pick up the gauze. Pour the salt solution over the gauze. Squeeze out the extra salt solution.  Put medicated cream or other medicine on your wound.  Put solution soaked gauze only in your wound, not on the skin around it.  Pack your wound loosely.  Put dry gauze on your wound.  Put belly pads over the dry gauze if your bandages soak through.  Tape the bandage in place so it will not fall off. Do not wrap the tape all the way around your arm or leg.  Wrap the bandage with the flexible gauze bandage as told by your doctor.  Take off your gloves. Put them in the plastic bag with the old  bandage. Tie the bag shut and throw it away.  Keep the bandage clean and dry.  Wash your hands. GET HELP RIGHT AWAY IF:   Your skin around the wound looks red.  Your wound feels more tender or sore.  You see yellowish-white fluid (pus) in the wound.  Your wound smells bad.  You have a fever.  Your skin around the wound has a red rash that itches and burns.  You see black or yellow skin in your wound that was not there before.  You feel sick to your stomach (nauseous), throw up (vomit), and feel very tired.   This information is not intended to replace advice given to you by your health care provider. Make sure you discuss any questions you have with your health care provider.   Document Released: 03/05/2009 Document Revised: 12/28/2014 Document Reviewed: 10/18/2011 Elsevier Interactive Patient Education 2016 Else[employer] Inc.  Perirectal Abscess  This is isn't really a perirectal abscess, but instructions are the same.Shower to clean sites then redress. An abscess is an infected area that contains a collection of pus. A perirectal abscess is an abscess that is near the opening of the anus or around the rectum. A perirectal abscess can cause a lot of pain, especially during bowel movements. CAUSES This condition is  almost always caused by an infection that starts in an anal gland. RISK FACTORS This condition is more likely to develop in:  People with diabetes or inflammatory bowel disease.  People whose body defense system (immune system) is weak.  People who have anal sex.  People who have a sexually transmitted disease (STD).  People who have certain kinds of cancers, such as rectal carcinoma, leukemia, or lymphoma. SYMPTOMS The main symptom of this condition is pain. The pain may be a throbbing pain that gets worse during bowel movements. Other symptoms include:  Fever.  Swelling.  Redness.  Bleeding.  Constipation. DIAGNOSIS The condition is diagnosed with a  physical exam. If the abscess is not visible, a health care provider may need to place a finger inside the rectum to find the abscess. Sometimes, imaging tests are done to determine the size and location of the abscess. These tests may include:  An ultrasound.  An MRI.  A CT scan. TREATMENT This condition is usually treated with incision and drainage surgery. Incision and drainage surgery involves making an incision over the abscess to drain the pus. Treatment may also involve antibiotic medicine, pain medicine, stool softeners, or laxatives. HOME CARE INSTRUCTIONS  Take medicines only as directed by your health care provider.  If you were prescribed an antibiotic, finish all of it even if you start to feel better.  To relieve pain, try sitting:  In a warm, shallow bath (sitz bath).  On a heating pad with the setting on low.  On an inflatable donut-shaped cushion.  Follow any diet instructions as directed by your health care provider.  Keep all follow-up visits as directed by your health care provider. This is important. SEEK MEDICAL CARE IF:  Your abscess is bleeding.  You have pain, swelling, or redness that is getting worse.  You are constipated.  You feel ill.  You have muscle aches or chills.  You have a fever.  Your symptoms return after the abscess has healed.   This information is not intended to replace advice given to you by your health care provider. Make sure you discuss any questions you have with your health care provider.   Document Released: 12/04/2000 Document Revised: 08/28/2015 Document Reviewed: 10/17/2014 Elsevier Interactive Patient Education [employer]

## 2016-07-17 NOTE — Progress Notes (Signed)
Discharge planning, spoke with patient at beside. Chose [facility] for HH services, contacted [facility] for referral. [phone] 

## 2016-07-18 NOTE — Progress Notes (Signed)
Discharge instructions discussed with patient until no further questions ask. Patient able to verbalize how to change dressing and has advanced home cares number.

## 2016-07-18 NOTE — Discharge Summary (Signed)
  Patient ID: Logan Rodriguez [id number] 39 y.o. Aug 14, 1976  07/15/2016  Discharge date and time: No discharge date for patient encounter.  Admitting Physician: Dr Gerrit Friends  Discharge Physician: Vanita Panda  Admission Diagnoses: perineal abcess scrotal abcess  Discharge Diagnoses: perineal abscess  Operations: Procedure(s): INCISION AND DRAINAGE AND OPEN PACKING PERINEAL AND SCROTAL ABSCESS    Discharged Condition: good    Hospital Course: Pt underwent I&D.  Placed on IV antibiotics and BID wound packing.  He was discharged when able to tolerate his wound packing.  Consults: None  Significant Diagnostic Studies: labs: cbc  Treatments: IV hydration, antibiotics: clindamycin and surgery: see above  Disposition: Home

## 2017-03-01 ENCOUNTER — Emergency Department (HOSPITAL_COMMUNITY)
Admission: EM | Admit: 2017-03-01 | Discharge: 2017-03-01 | Disposition: A | Discharge status: Home / Self Care | Payer: Medicare Other | Attending: Emergency Medicine | Admitting: Emergency Medicine

## 2017-03-01 ENCOUNTER — Encounter (HOSPITAL_COMMUNITY): Payer: Self-pay | Admitting: Emergency Medicine

## 2017-03-01 DIAGNOSIS — Z87891 Personal history of nicotine dependence: Secondary | ICD-10-CM | POA: Diagnosis not present

## 2017-03-01 DIAGNOSIS — Z79899 Other long term (current) drug therapy: Secondary | ICD-10-CM | POA: Diagnosis not present

## 2017-03-01 DIAGNOSIS — L02411 Cutaneous abscess of right axilla: Secondary | ICD-10-CM | POA: Diagnosis present

## 2017-03-01 MED ORDER — LIDOCAINE-EPINEPHRINE (PF) 2 %-1:200000 IJ SOLN
10.0000 mL | Freq: Once | INTRAMUSCULAR | Status: DC
  Filled 2017-03-01: qty 20

## 2017-03-01 NOTE — ED Provider Notes (Signed)
[facility]-EMERGENCY DEPT Provider Note   CSN: [id number] Arrival date & time: 03/01/17  0604     History   Chief Complaint Chief Complaint  Patient presents with  . Recurrent Skin Infections    HPI Logan Rodriguez is a 41 y.o. male.  HPI Logan Rodriguez is a 41 y.o. male presents to emergency department complaining of an abscess to the right axilla. Patient states his symptoms began approximately 5 days ago. States went to his family doctor 2 days ago and was placed on Keflex. States that the abscess has been getting worse since then. Denies any drainage. Reports redness, pain, swelling. Denies any fever or chills. No other treatment besides Keflex. History of the same.  Past Medical History:  Diagnosis Date  . Abscess     Patient Active Problem List   Diagnosis Date Noted  . Abscess of perineum 07/15/2016  . Perineal abscess 12/11/2015    Past Surgical History:  Procedure Laterality Date  . INCISION AND DRAINAGE PERIRECTAL ABSCESS Left 12/11/2015   Procedure: IRRIGATION AND DEBRIDEMENT PERINEAL ABSCESS;  Surgeon: Chevis Pretty III, MD;  Location: [facility] OR;  Service: General;  Laterality: Left;  . IRRIGATION AND DEBRIDEMENT ABSCESS N/A 07/16/2016   Procedure: INCISION AND DRAINAGE AND OPEN PACKING PERINEAL AND SCROTAL ABSCESS;  Surgeon: Darnell Level, MD;  Location: [facility] ORS;  Service: General;  Laterality: N/A;       Home Medications    Prior to Admission medications   Medication Sig Start Date End Date Taking? Authorizing Provider  acetaminophen (TYLENOL) 325 MG tablet Take 2 tablets (650 mg total) by mouth every 6 (six) hours as needed for mild pain (or temp > 100). 07/17/16   Sherrie George, PA-C  clindamycin (CLEOCIN) 300 MG capsule Take 1 capsule (300 mg total) by mouth 3 (three) times daily. Complete your course of antibiotics as instructed before admission. 07/17/16   Sherrie George, PA-C  HYDROcodone-acetaminophen (NORCO/VICODIN) 5-325 MG tablet Take 1-2 tablets by  mouth every 4 (four) hours as needed for moderate pain. 07/17/16   Sherrie George, PA-C  ibuprofen (ADVIL,MOTRIN) 200 MG tablet Take 600 mg by mouth every 6 (six) hours as needed for headache, mild pain or moderate pain.    Historical Provider, MD    Family History No family history on file.  Social History Social History  Substance Use Topics  . Smoking status: Former Smoker    Types: Cigarettes  . Smokeless tobacco: Never Used  . Alcohol use Yes     Comment: occ     Allergies   Doxycycline and Septra [sulfamethoxazole-trimethoprim]   Review of Systems Review of Systems  Constitutional: Negative for chills and fever.  Skin: Positive for color change and wound.  Neurological: Negative for headaches.  All other systems reviewed and are negative.    Physical Exam Updated Vital Signs BP 144/96 (BP Location: Left Arm)   Pulse 73   Temp 97.7 F (36.5 C) (Oral)   Resp 14   Ht 5\' 8"  (1.727 m)   Wt 90.3 kg   SpO2 100%   BMI 30.26 kg/m   Physical Exam  Constitutional: He appears well-developed and well-nourished. No distress.  Eyes: Conjunctivae are normal.  Neck: Neck supple.  Cardiovascular: Normal rate.   Pulmonary/Chest: No respiratory distress.  Abdominal: He exhibits no distension.  Skin: Skin is warm and dry.  4 x 4 centimeters area of induration, erythema, swelling to the right axilla. Tender to palpation. Fluctuance noted.  Nursing note and vitals reviewed.  ED Treatments / Results  Labs (all labs ordered are listed, but only abnormal results are displayed) Labs Reviewed - No data to display  EKG  EKG Interpretation None       Radiology No results found.  Procedures Procedures (including critical care time)  INCISION AND DRAINAGE Performed by: Jaynie CrumbleKIRICHENKO, Lailana Shira A Consent: Verbal consent obtained. Risks and benefits: risks, benefits and alternatives were discussed Type: abscess  Body area: right axilla  Anesthesia: local  infiltration  Incision was made with a scalpel.  Local anesthetic: lidocaine 2% w epinephrine  Anesthetic total: 3 ml  Complexity: complex Blunt dissection to break up loculations  Drainage: purulent  Drainage amount: large  Packing material: 1/4 in iodoform gauze  Patient tolerance: Patient tolerated the procedure well with no immediate complications.    Medications Ordered in ED Medications  lidocaine-EPINEPHrine (XYLOCAINE W/EPI) 2 %-1:200000 (PF) injection 10 mL (not administered)     Initial Impression / Assessment and Plan / ED Course  I have reviewed the triage vital signs and the nursing notes.  Pertinent labs & imaging results that were available during my care of the patient were reviewed by me and considered in my medical decision making (see chart for details).    Patient with an abscess to the right axilla, incised and drained with large purulent drainage. Packed. Patient is already on Keflex, has taken it for 2 days, will continue Keflex. He is allergic to Bactrim and doxycycline. He does not have history of known MRSA infection. Instructed to continue taking Keflex, if not improving follow-up with the family doctor return to emergency department. Continue warm compresses. Return precautions discussed.  Vitals:   03/01/17 0614 03/01/17 0615  BP: 144/96   Pulse: 73   Resp: 14   Temp: 97.7 F (36.5 C)   TempSrc: Oral   SpO2: 100%   Weight:  90.3 kg  Height:  5\' 8"  (1.727 m)     Final Clinical Impressions(s) / ED Diagnoses   Final diagnoses:  Abscess of right axilla    New Prescriptions New Prescriptions   No medications on file     Jaynie Crumbleatyana Tushar Enns, PA-C 03/01/17 0709    Layla MawKristen N Ward, DO 03/01/17 0710

## 2017-03-01 NOTE — Discharge Instructions (Signed)
Ibuprofen/tylenol for pain. Continue keflex for infection. Follow up with family doctor in 2 days as needed. Return if worsening.

## 2017-03-01 NOTE — ED Notes (Signed)
Pt stated he got a boil under his arm that he noticed on Saturday. Pt states that 1 month ago he had something similar and it was staph. Pt was on antibiotics but finished his course.  

## 2017-03-01 NOTE — ED Triage Notes (Signed)
Pt stated he got a boil under his arm that he noticed on Saturday. Pt states that 1 month ago he had something similar and it was staph. Pt was on antibiotics but finished his course.

## 2017-03-12 ENCOUNTER — Encounter (HOSPITAL_COMMUNITY): Payer: Self-pay | Admitting: *Deleted

## 2017-03-12 ENCOUNTER — Emergency Department (HOSPITAL_COMMUNITY)
Admission: EM | Admit: 2017-03-12 | Discharge: 2017-03-12 | Disposition: A | Discharge status: Home / Self Care | Payer: Medicare Other | Attending: Emergency Medicine | Admitting: Emergency Medicine

## 2017-03-12 DIAGNOSIS — N492 Inflammatory disorders of scrotum: Secondary | ICD-10-CM | POA: Diagnosis not present

## 2017-03-12 DIAGNOSIS — N5082 Scrotal pain: Secondary | ICD-10-CM | POA: Diagnosis present

## 2017-03-12 DIAGNOSIS — Z23 Encounter for immunization: Secondary | ICD-10-CM | POA: Insufficient documentation

## 2017-03-12 DIAGNOSIS — Z87891 Personal history of nicotine dependence: Secondary | ICD-10-CM | POA: Diagnosis not present

## 2017-03-12 LAB — COMPREHENSIVE METABOLIC PANEL
ALT: 21 U/L (ref 17–63)
AST: 20 U/L (ref 15–41)
Albumin: 4.1 g/dL (ref 3.5–5.0)
Alkaline Phosphatase: 51 U/L (ref 38–126)
Anion gap: 9 (ref 5–15)
BILIRUBIN TOTAL: 0.8 mg/dL (ref 0.3–1.2)
BUN: 15 mg/dL (ref 6–20)
CO2: 28 mmol/L (ref 22–32)
CREATININE: 1.09 mg/dL (ref 0.61–1.24)
Calcium: 9.1 mg/dL (ref 8.9–10.3)
Chloride: 102 mmol/L (ref 101–111)
GFR calc Af Amer: >60 mL/min (ref >60)
Glucose, Bld: 93 mg/dL (ref 65–99)
Potassium: 3.4 mmol/L — ABNORMAL LOW (ref 3.5–5.1)
Sodium: 139 mmol/L (ref 135–145)
TOTAL PROTEIN: 6.8 g/dL (ref 6.5–8.1)

## 2017-03-12 LAB — URINALYSIS, ROUTINE W REFLEX MICROSCOPIC
Bilirubin Urine: NEGATIVE
Glucose, UA: NEGATIVE mg/dL
Hgb urine dipstick: NEGATIVE
KETONES UR: NEGATIVE mg/dL
LEUKOCYTES UA: NEGATIVE
NITRITE: NEGATIVE
PROTEIN: NEGATIVE mg/dL
Specific Gravity, Urine: 1.014 (ref 1.005–1.030)
pH: 6 (ref 5.0–8.0)

## 2017-03-12 LAB — CBC
HCT: 40.1 % (ref 39.0–52.0)
Hemoglobin: 13.5 g/dL (ref 13.0–17.0)
MCH: 30.1 pg (ref 26.0–34.0)
MCHC: 33.7 g/dL (ref 30.0–36.0)
MCV: 89.5 fL (ref 78.0–100.0)
PLATELETS: 273 10*3/uL (ref 150–400)
RBC: 4.48 MIL/uL (ref 4.22–5.81)
RDW: 13.4 % (ref 11.5–15.5)
WBC: 11.1 10*3/uL — ABNORMAL HIGH (ref 4.0–10.5)

## 2017-03-12 MED ORDER — TETANUS-DIPHTH-ACELL PERTUSSIS 5-2.5-18.5 LF-MCG/0.5 IM SUSP
0.5000 mL | Freq: Once | INTRAMUSCULAR | Status: AC
  Administered 2017-03-12: 0.5 mL via INTRAMUSCULAR
  Filled 2017-03-12: qty 0.5

## 2017-03-12 NOTE — ED Notes (Signed)
Clindamycin antibiotic finished Monday

## 2017-03-12 NOTE — ED Triage Notes (Signed)
The pt has had a swollen rt testicle for one week he has had in the past  He is taking an antibiotic for a boil under his rt arm  He took it for 10 days

## 2017-03-12 NOTE — ED Notes (Signed)
Pt stable, ambulatory, states understanding of discharge instructions 

## 2017-03-12 NOTE — ED Provider Notes (Signed)
[facility]-EMERGENCY DEPT Provider Note   CSN: [id number] Arrival date & time: 03/12/17  1854     History   Chief Complaint Chief Complaint  Patient presents with  . Testicle Pain    HPI Logan Rodriguez is a 41 y.o. male.  Patient is a 41 year old male with a history of prior abscesses who presents with pain in his scrotum. He states it started about 2 days ago. He recently was treated for an abscess in his right axilla which has resolved. He stopped taking antibiotics about a week ago. He's had a prior abscess status post I and D of his scrotum about a year ago. [facility]. He denies any fevers. No abdominal pain. No urinary symptoms. No nausea or vomiting.      Past Medical History:  Diagnosis Date  . Abscess     Patient Active Problem List   Diagnosis Date Noted  . Abscess of perineum 07/15/2016  . Perineal abscess 12/11/2015    Past Surgical History:  Procedure Laterality Date  . INCISION AND DRAINAGE PERIRECTAL ABSCESS Left 12/11/2015   Procedure: IRRIGATION AND DEBRIDEMENT PERINEAL ABSCESS;  Surgeon: Chevis PrettyPaul Toth III, MD;  Location: [facility] OR;  Service: General;  Laterality: Left;  . IRRIGATION AND DEBRIDEMENT ABSCESS N/A 07/16/2016   Procedure: INCISION AND DRAINAGE AND OPEN PACKING PERINEAL AND SCROTAL ABSCESS;  Surgeon: Darnell Levelodd Gerkin, MD;  Location: [facility] ORS;  Service: General;  Laterality: N/A;       Home Medications    Prior to Admission medications   Medication Sig Start Date End Date Taking? Authorizing Provider  acetaminophen (TYLENOL) 325 MG tablet Take 2 tablets (650 mg total) by mouth every 6 (six) hours as needed for mild pain (or temp > 100). 07/17/16   Sherrie GeorgeWillard Jennings, PA-C  clindamycin (CLEOCIN) 300 MG capsule Take 1 capsule (300 mg total) by mouth 3 (three) times daily. Complete your course of antibiotics as instructed before admission. 07/17/16   Sherrie GeorgeWillard Jennings, PA-C  HYDROcodone-acetaminophen (NORCO/VICODIN) 5-325  MG tablet Take 1-2 tablets by mouth every 4 (four) hours as needed for moderate pain. 07/17/16   Sherrie GeorgeWillard Jennings, PA-C  ibuprofen (ADVIL,MOTRIN) 200 MG tablet Take 600 mg by mouth every 6 (six) hours as needed for headache, mild pain or moderate pain.    Historical Provider, MD    Family History No family history on file.  Social History Social History  Substance Use Topics  . Smoking status: Former Smoker    Types: Cigarettes  . Smokeless tobacco: Never Used  . Alcohol use Yes     Comment: occ     Allergies   Doxycycline and Septra [sulfamethoxazole-trimethoprim]   Review of Systems Review of Systems  Constitutional: Negative for chills, diaphoresis, fatigue and fever.  HENT: Negative for congestion, rhinorrhea and sneezing.   Eyes: Negative.   Respiratory: Negative for cough, chest tightness and shortness of breath.   Cardiovascular: Negative for chest pain and leg swelling.  Gastrointestinal: Negative for abdominal pain, blood in stool, diarrhea, nausea and vomiting.  Genitourinary: Positive for scrotal swelling. Negative for difficulty urinating, flank pain, frequency, hematuria and testicular pain.  Musculoskeletal: Negative for arthralgias and back pain.  Skin: Positive for wound. Negative for rash.  Neurological: Negative for dizziness, speech difficulty, weakness, numbness and headaches.     Physical Exam Updated Vital Signs BP (!) 146/87   Pulse 87   Temp 98.6 F (37 C) (Oral)   Resp 16   Ht 5\' 8"  (  1.727 m)   Wt 203 lb 3 oz (92.2 kg)   SpO2 100%   BMI 30.89 kg/m   Physical Exam  Constitutional: He is oriented to person, place, and time. He appears well-developed and well-nourished.  HENT:  Head: Normocephalic and atraumatic.  Eyes: Pupils are equal, round, and reactive to light.  Neck: Normal range of motion. Neck supple.  Cardiovascular: Normal rate, regular rhythm and normal heart sounds.   Pulmonary/Chest: Effort normal and breath sounds normal. No  respiratory distress. He has no wheezes. He has no rales. He exhibits no tenderness.  Abdominal: Soft. Bowel sounds are normal. There is no tenderness. There is no rebound and no guarding.  Genitourinary:  Genitourinary Comments: Patient has a small, less than 1 cm indurated area to the right scrotal sac. There is mild surrounding erythema. No testicular pain. No drainage.  Musculoskeletal: Normal range of motion. He exhibits no edema.  Lymphadenopathy:    He has no cervical adenopathy.  Neurological: He is alert and oriented to person, place, and time.  Skin: Skin is warm and dry. No rash noted.  Psychiatric: He has a normal mood and affect.     ED Treatments / Results  Labs (all labs ordered are listed, but only abnormal results are displayed) Labs Reviewed  COMPREHENSIVE METABOLIC PANEL - Abnormal; Notable for the following:       Result Value   Potassium 3.4 (*)    All other components within normal limits  CBC - Abnormal; Notable for the following:    WBC 11.1 (*)    All other components within normal limits  URINALYSIS, ROUTINE W REFLEX MICROSCOPIC    EKG  EKG Interpretation None       Radiology No results found.  Procedures Procedures (including critical care time)  Medications Ordered in ED Medications  Tdap (BOOSTRIX) injection 0.5 mL (0.5 mLs Intramuscular Given 03/12/17 2108)     Initial Impression / Assessment and Plan / ED Course  I have reviewed the triage vital signs and the nursing notes.  Pertinent labs & imaging results that were available during my care of the patient were reviewed by me and considered in my medical decision making (see chart for details).     Patient has a small early abscess to his right scrotal sac. At this point I don't feel that it's amenable to I and D. I encouraged him to start back on the clindamycin. He has refills on his clindamycin as he's had recurrent infections. I encouraged him to do warm compresses. He is going to  follow-up with [facility] on Monday for recheck. He was advised return here if he has any worsening symptoms in the meantime. His tetanus shot was updated.  Final Clinical Impressions(s) / ED Diagnoses   Final diagnoses:  Abscess of scrotum    New Prescriptions Discharge Medication List as of 03/12/2017  9:02 PM       Rolan Bucco, MD 03/12/17 2316

## 2017-10-05 ENCOUNTER — Emergency Department (HOSPITAL_COMMUNITY)
Admission: EM | Admit: 2017-10-05 | Discharge: 2017-10-06 | Disposition: A | Discharge status: Home / Self Care | Payer: Medicare Other | Attending: Emergency Medicine | Admitting: Emergency Medicine

## 2017-10-05 ENCOUNTER — Encounter (HOSPITAL_COMMUNITY): Payer: Self-pay | Admitting: *Deleted

## 2017-10-05 ENCOUNTER — Emergency Department (HOSPITAL_COMMUNITY): Payer: Medicare Other

## 2017-10-05 DIAGNOSIS — J069 Acute upper respiratory infection, unspecified: Secondary | ICD-10-CM | POA: Insufficient documentation

## 2017-10-05 DIAGNOSIS — Z87891 Personal history of nicotine dependence: Secondary | ICD-10-CM | POA: Diagnosis not present

## 2017-10-05 DIAGNOSIS — R05 Cough: Secondary | ICD-10-CM | POA: Diagnosis present

## 2017-10-05 DIAGNOSIS — Z79899 Other long term (current) drug therapy: Secondary | ICD-10-CM | POA: Diagnosis not present

## 2017-10-05 MED ORDER — BENZONATATE 100 MG PO CAPS: 200.0000 mg | ORAL_CAPSULE | Freq: Three times a day (TID) | ORAL | 0 refills | Status: DC

## 2017-10-05 NOTE — ED Triage Notes (Signed)
Pt has been having some URI symptoms with some cough and congestion as generally feeling unwell.  Pt has been having some persistent hiccups.  No acute distress at this time, no CP

## 2017-10-05 NOTE — ED Provider Notes (Signed)
[facility] EMERGENCY DEPARTMENT Provider Note   CSN: [id number] Arrival date & time: 10/05/17  2025     History   Chief Complaint Chief Complaint  Patient presents with  . URI    HPI Logan Rodriguez is a 41 y.o. male presents to ED for evaluation of cough, congestion, rhinorrhea, ear pain for the past 2 days. States that cough is productive with yellowish sputum. He also reports fever of 101 at home. He is taking ibuprofen with mild relief in symptoms. Denies any sick contacts with similar symptoms. He denies any nausea, vomiting, hemoptysis, chest pain, trouble breathing.  HPI  Past Medical History:  Diagnosis Date  . Abscess     Patient Active Problem List   Diagnosis Date Noted  . Abscess of perineum 07/15/2016  . Perineal abscess 12/11/2015    Past Surgical History:  Procedure Laterality Date  . INCISION AND DRAINAGE PERIRECTAL ABSCESS Left 12/11/2015   Procedure: IRRIGATION AND DEBRIDEMENT PERINEAL ABSCESS;  Surgeon: Chevis Pretty III, MD;  Location: [facility] OR;  Service: General;  Laterality: Left;  . IRRIGATION AND DEBRIDEMENT ABSCESS N/A 07/16/2016   Procedure: INCISION AND DRAINAGE AND OPEN PACKING PERINEAL AND SCROTAL ABSCESS;  Surgeon: Darnell Level, MD;  Location: [facility];  Service: General;  Laterality: N/A;       Home Medications    Prior to Admission medications   Medication Sig Start Date End Date Taking? Authorizing Provider  acetaminophen (TYLENOL) 325 MG tablet Take 2 tablets (650 mg total) by mouth every 6 (six) hours as needed for mild pain (or temp > 100). 07/17/16   Sherrie George, PA-C  benzonatate (TESSALON) 100 MG capsule Take 2 capsules (200 mg total) by mouth every 8 (eight) hours. 10/05/17   Lawrie Tunks, PA-C  clindamycin (CLEOCIN) 300 MG capsule Take 1 capsule (300 mg total) by mouth 3 (three) times daily. Complete your course of antibiotics as instructed before admission. 07/17/16   Sherrie George, PA-C    HYDROcodone-acetaminophen (NORCO/VICODIN) 5-325 MG tablet Take 1-2 tablets by mouth every 4 (four) hours as needed for moderate pain. 07/17/16   Sherrie George, PA-C  ibuprofen (ADVIL,MOTRIN) 200 MG tablet Take 600 mg by mouth every 6 (six) hours as needed for headache, mild pain or moderate pain.    [provider]    Family History No family history on file.  Social History Social History  Substance Use Topics  . Smoking status: Former Smoker    Types: Cigarettes  . Smokeless tobacco: Never Used  . Alcohol use Yes     Comment: occ     Allergies   Doxycycline and Septra [sulfamethoxazole-trimethoprim]   Review of Systems Review of Systems  Constitutional: Positive for chills and fever.  HENT: Positive for congestion, ear pain, rhinorrhea and sinus pressure. Negative for ear discharge, hearing loss, sneezing, tinnitus, trouble swallowing and voice change.   Respiratory: Positive for cough. Negative for shortness of breath.   Cardiovascular: Negative for chest pain.  Gastrointestinal: Negative for nausea and vomiting.  Musculoskeletal: Negative for neck pain and neck stiffness.  Skin: Negative for rash and wound.     Physical Exam Updated Vital Signs BP (!) 144/82 (BP Location: Right Arm)   Pulse 89   Temp 99 F (37.2 C) (Oral)   Resp 16   Ht  (1.676 m)   Wt 92.5 kg (204 lb)   SpO2 99%   BMI 32.93 kg/m   Physical Exam  Constitutional: He appears well-developed and well-nourished.  No distress.  HENT:  Head: Normocephalic and atraumatic.  Right Ear: Tympanic membrane normal.  Left Ear: Tympanic membrane normal.  Nose: Mucosal edema present.  Mouth/Throat: Posterior oropharyngeal erythema present.  Eyes: Conjunctivae and EOM are normal. No scleral icterus.  Neck: Normal range of motion.  Cardiovascular: Normal rate and normal heart sounds.   Pulmonary/Chest: Effort normal and breath sounds normal. No respiratory distress. He has no wheezes.   Neurological: He is alert.  Skin: No rash noted. He is not diaphoretic.  Psychiatric: He has a normal mood and affect.  Nursing note and vitals reviewed.    ED Treatments / Results  Labs (all labs ordered are listed, but only abnormal results are displayed) Labs Reviewed - No data to display  EKG  EKG Interpretation None       Radiology Dg Chest 2 View  Result Date: 10/05/2017 CLINICAL DATA:  Cough, fever dyspnea x2 days EXAM: CHEST  2 VIEW COMPARISON:  Report from 12/26/2015 FINDINGS: The heart size and mediastinal contours are within normal limits. Both lungs are clear. The visualized skeletal structures are unremarkable. IMPRESSION: No active cardiopulmonary disease. Electronically Signed   By: Tollie Eth M.D.   On: 10/05/2017 23:24    Procedures Procedures (including critical care time)  Medications Ordered in ED Medications - No data to display   Initial Impression / Assessment and Plan / ED Course  I have reviewed the triage vital signs and the nursing notes.  Pertinent labs & imaging results that were available during my care of the patient were reviewed by me and considered in my medical decision making (see chart for details).     Patient presents to ED for evaluation of URI symptoms including cough, congestion, rhinorrhea, ear pain for the past 2 days. Also reports fever of 101 at home. States that cough is productive with yellowish sputum. Here he is afebrile at 1F. Lungs are clear to auscultation bilaterally.No focal findings on HEENT exam. Patient is nontoxic-appearing and in no acute distress. He is tolerating secretions with no signs of airway compromise. Chest x-ray returned as negative. I suspect that his symptoms are due to a viral URI rather than any severe bacterial infection or pneumonia based on his imaging and physical exam findings. We'll give some  Symptomatic treatment with Tessalon Perles and advised him to take Tylenol, ibuprofen or sinus  medication as needed. Advised to follow with PCP for further evaluation. Patient appears stable for discharge at this time. Strict return precautions given.  Final Clinical Impressions(s) / ED Diagnoses   Final diagnoses:  Viral upper respiratory tract infection    New Prescriptions New Prescriptions   BENZONATATE (TESSALON) 100 MG CAPSULE    Take 2 capsules (200 mg total) by mouth every 8 (eight) hours.     Dietrich Pates, PA-C 10/05/17 2349    Linwood Dibbles, MD 10/05/17 2351

## 2017-10-05 NOTE — Discharge Instructions (Signed)
Please reattach information regarding your condition. Take Tessalon Perles as needed for cough. Take Sudafed and ibuprofen or Tylenol as needed for pain and sinus symptoms. Return to ED for worsening pain, trouble breathing, trouble swallowing, chest pain, cough productive with blood or more sputum.

## 2017-12-04 ENCOUNTER — Emergency Department (HOSPITAL_COMMUNITY)
Admission: EM | Admit: 2017-12-04 | Discharge: 2017-12-04 | Disposition: A | Discharge status: Home / Self Care | Payer: Medicare Other | Attending: Emergency Medicine | Admitting: Emergency Medicine

## 2017-12-04 ENCOUNTER — Encounter (HOSPITAL_COMMUNITY): Payer: Self-pay

## 2017-12-04 ENCOUNTER — Other Ambulatory Visit: Payer: Self-pay

## 2017-12-04 DIAGNOSIS — Z79899 Other long term (current) drug therapy: Secondary | ICD-10-CM | POA: Insufficient documentation

## 2017-12-04 DIAGNOSIS — Z87891 Personal history of nicotine dependence: Secondary | ICD-10-CM | POA: Diagnosis not present

## 2017-12-04 DIAGNOSIS — L0231 Cutaneous abscess of buttock: Secondary | ICD-10-CM | POA: Insufficient documentation

## 2017-12-04 DIAGNOSIS — R600 Localized edema: Secondary | ICD-10-CM | POA: Diagnosis present

## 2017-12-04 MED ORDER — LIDOCAINE-EPINEPHRINE (PF) 2 %-1:200000 IJ SOLN
20.0000 mL | Freq: Once | INTRAMUSCULAR | Status: AC
  Administered 2017-12-04: 20 mL
  Filled 2017-12-04: qty 20

## 2017-12-04 NOTE — Discharge Instructions (Signed)
Get help right away if: °You have severe pain or bleeding. °You cannot eat or drink without vomiting. °You have decreased urine output. °You become short of breath. °You have chest pain. °You cough up blood. °The area where the incision and drainage occurred becomes numb or it tingles. °

## 2017-12-04 NOTE — ED Triage Notes (Signed)
Patient complains of pain and swelling to buttocks x 5 days, no drainage, pain with sitting

## 2017-12-04 NOTE — ED Provider Notes (Signed)
[facility] Provider Note   CSN: [id number] Arrival date & time: 12/04/17  [id number]     History   Chief Complaint Chief Complaint  Patient presents with  . pain/swelling/buttocks    HPI Esaw DaceBradford A Fluegel is a 41 y.o. male with a past medical history of recurrent perineal abscesses.  Patient presents with chief complaint of pain and swelling at the top of his gluteal cleft.  This is been there for approximately 4 or 5 days.  Patient states that it hurts whenever it is pressed or he sits down.  He also has pain when ambulating.  He denies any pain with defecation.  He denies fevers or chills.  HPI  Past Medical History:  Diagnosis Date  . Abscess     Patient Active Problem List   Diagnosis Date Noted  . Abscess of perineum 07/15/2016  . Perineal abscess 12/11/2015    Past Surgical History:  Procedure Laterality Date  . INCISION AND DRAINAGE PERIRECTAL ABSCESS Left 12/11/2015   Procedure: IRRIGATION AND DEBRIDEMENT PERINEAL ABSCESS;  Surgeon: Chevis PrettyPaul Toth III, MD;  Location: [facility] OR;  Service: General;  Laterality: Left;  . IRRIGATION AND DEBRIDEMENT ABSCESS N/A 07/16/2016   Procedure: INCISION AND DRAINAGE AND OPEN PACKING PERINEAL AND SCROTAL ABSCESS;  Surgeon: Darnell Levelodd Gerkin, MD;  Location: [facility] ORS;  Service: General;  Laterality: N/A;       Home Medications    Prior to Admission medications   Medication Sig Start Date End Date Taking? Authorizing Provider  acetaminophen (TYLENOL) 325 MG tablet Take 2 tablets (650 mg total) by mouth every 6 (six) hours as needed for mild pain (or temp > 100). 07/17/16   Sherrie GeorgeJennings, Willard, PA-C  benzonatate (TESSALON) 100 MG capsule Take 2 capsules (200 mg total) by mouth every 8 (eight) hours. 10/05/17   Khatri, Hina, PA-C  clindamycin (CLEOCIN) 300 MG capsule Take 1 capsule (300 mg total) by mouth 3 (three) times daily. Complete your course of antibiotics as instructed before admission. 07/17/16   Sherrie GeorgeJennings,  Willard, PA-C  HYDROcodone-acetaminophen (NORCO/VICODIN) 5-325 MG tablet Take 1-2 tablets by mouth every 4 (four) hours as needed for moderate pain. 07/17/16   Sherrie GeorgeJennings, Willard, PA-C  ibuprofen (ADVIL,MOTRIN) 200 MG tablet Take 600 mg by mouth every 6 (six) hours as needed for headache, mild pain or moderate pain.    [provider]    Family History No family history on file.  Social History Social History   Tobacco Use  . Smoking status: Former Smoker    Types: Cigarettes  . Smokeless tobacco: Never Used  Substance Use Topics  . Alcohol use: Yes    Comment: occ  . Drug use: Yes    Frequency: 4.0 times per week    Types: Marijuana     Allergies   Doxycycline and Septra [sulfamethoxazole-trimethoprim]   Review of Systems Review of Systems  Ten systems reviewed and are negative for acute change, except as noted in the HPI.   Physical Exam Updated Vital Signs BP (!) 151/100   Pulse 97   Temp 98.7 F (37.1 C) (Oral)   Resp 18   SpO2 98%   Physical Exam  Constitutional: He appears well-developed and well-nourished. No distress.  HENT:  Head: Normocephalic and atraumatic.  Eyes: Conjunctivae are normal. No scleral icterus.  Neck: Normal range of motion. Neck supple.  Cardiovascular: Normal rate, regular rhythm and normal heart sounds.  Pulmonary/Chest: Effort normal and breath sounds normal. No respiratory distress.  Abdominal:  Soft. There is no tenderness.  Musculoskeletal: He exhibits no edema.  Erythema and tenderness at the superior aspect of the gluteal cleft. Fluctuance centrally.   Neurological: He is alert.  Skin: Skin is warm and dry. He is not diaphoretic.  Psychiatric: His behavior is normal.  Nursing note and vitals reviewed.    ED Treatments / Results  Labs (all labs ordered are listed, but only abnormal results are displayed) Labs Reviewed - No data to display  EKG  EKG Interpretation None       Radiology No results  found.  Procedures .Marland Kitchen.Incision and Drainage Date/Time: 12/04/2017 4:45 PM Performed by: Arthor CaptainHarris, Ardell Makarewicz, PA-C Authorized by: Arthor CaptainHarris, Erez Mccallum, PA-C   Consent:    Consent obtained:  Verbal   Consent given by:  Patient   Risks discussed:  Bleeding, incomplete drainage, pain, damage to other organs and infection Location:    Type:  Abscess   Size:  Large   Location:  Anogenital   Anogenital location:  Gluteal cleft Pre-procedure details:    Skin preparation:  Betadine Anesthesia (see MAR for exact dosages):    Anesthesia method:  Local infiltration   Local anesthetic:  Lidocaine 2% WITH epi Procedure type:    Complexity:  Simple Procedure details:    Needle aspiration: yes     Needle size:  18 G   Incision types:  Single straight   Incision depth:  Subcutaneous   Scalpel blade:  11   Wound management:  Probed and deloculated, irrigated with saline and extensive cleaning   Drainage:  Purulent   Drainage amount:  Copious   Wound treatment:  Wound left open   Packing materials:  1/4 in iodoform gauze Post-procedure details:    Patient tolerance of procedure:  Tolerated well, no immediate complications   (including critical care time)  Medications Ordered in ED Medications  lidocaine-EPINEPHrine (XYLOCAINE W/EPI) 1 %-1:100000 (with pres) injection 20 mL (not administered)     Initial Impression / Assessment and Plan / ED Course  I have reviewed the triage vital signs and the nursing notes.  Pertinent labs & imaging results that were available during my care of the patient were reviewed by me and considered in my medical decision making (see chart for details).     Patient with skin abscess amenable to incision and drainage.  Abscess was large and warranted packing. wound recheck in 2 days. Encouraged home warm soaks and flushing.  Mild signs of cellulitis is surrounding skin.  Will d/c to home.  No antibiotic therapy is indicated.   Final Clinical Impressions(s) / ED  Diagnoses   Final diagnoses:  Abscess of gluteal cleft    ED Discharge Orders    None       Arthor CaptainHarris, Amaziah Raisanen, PA-C 12/04/17 1647    Rolan BuccoBelfi, Melanie, MD 12/05/17 [phone]

## 2017-12-23 IMAGING — DX DG CHEST 2V
2 series · 2 of 2 positions shown · non-contrast
Comparison: Report from 12/26/2015

CLINICAL DATA: Cough, fever dyspnea x2 days

EXAM:
CHEST  2 VIEW

[chest pa]
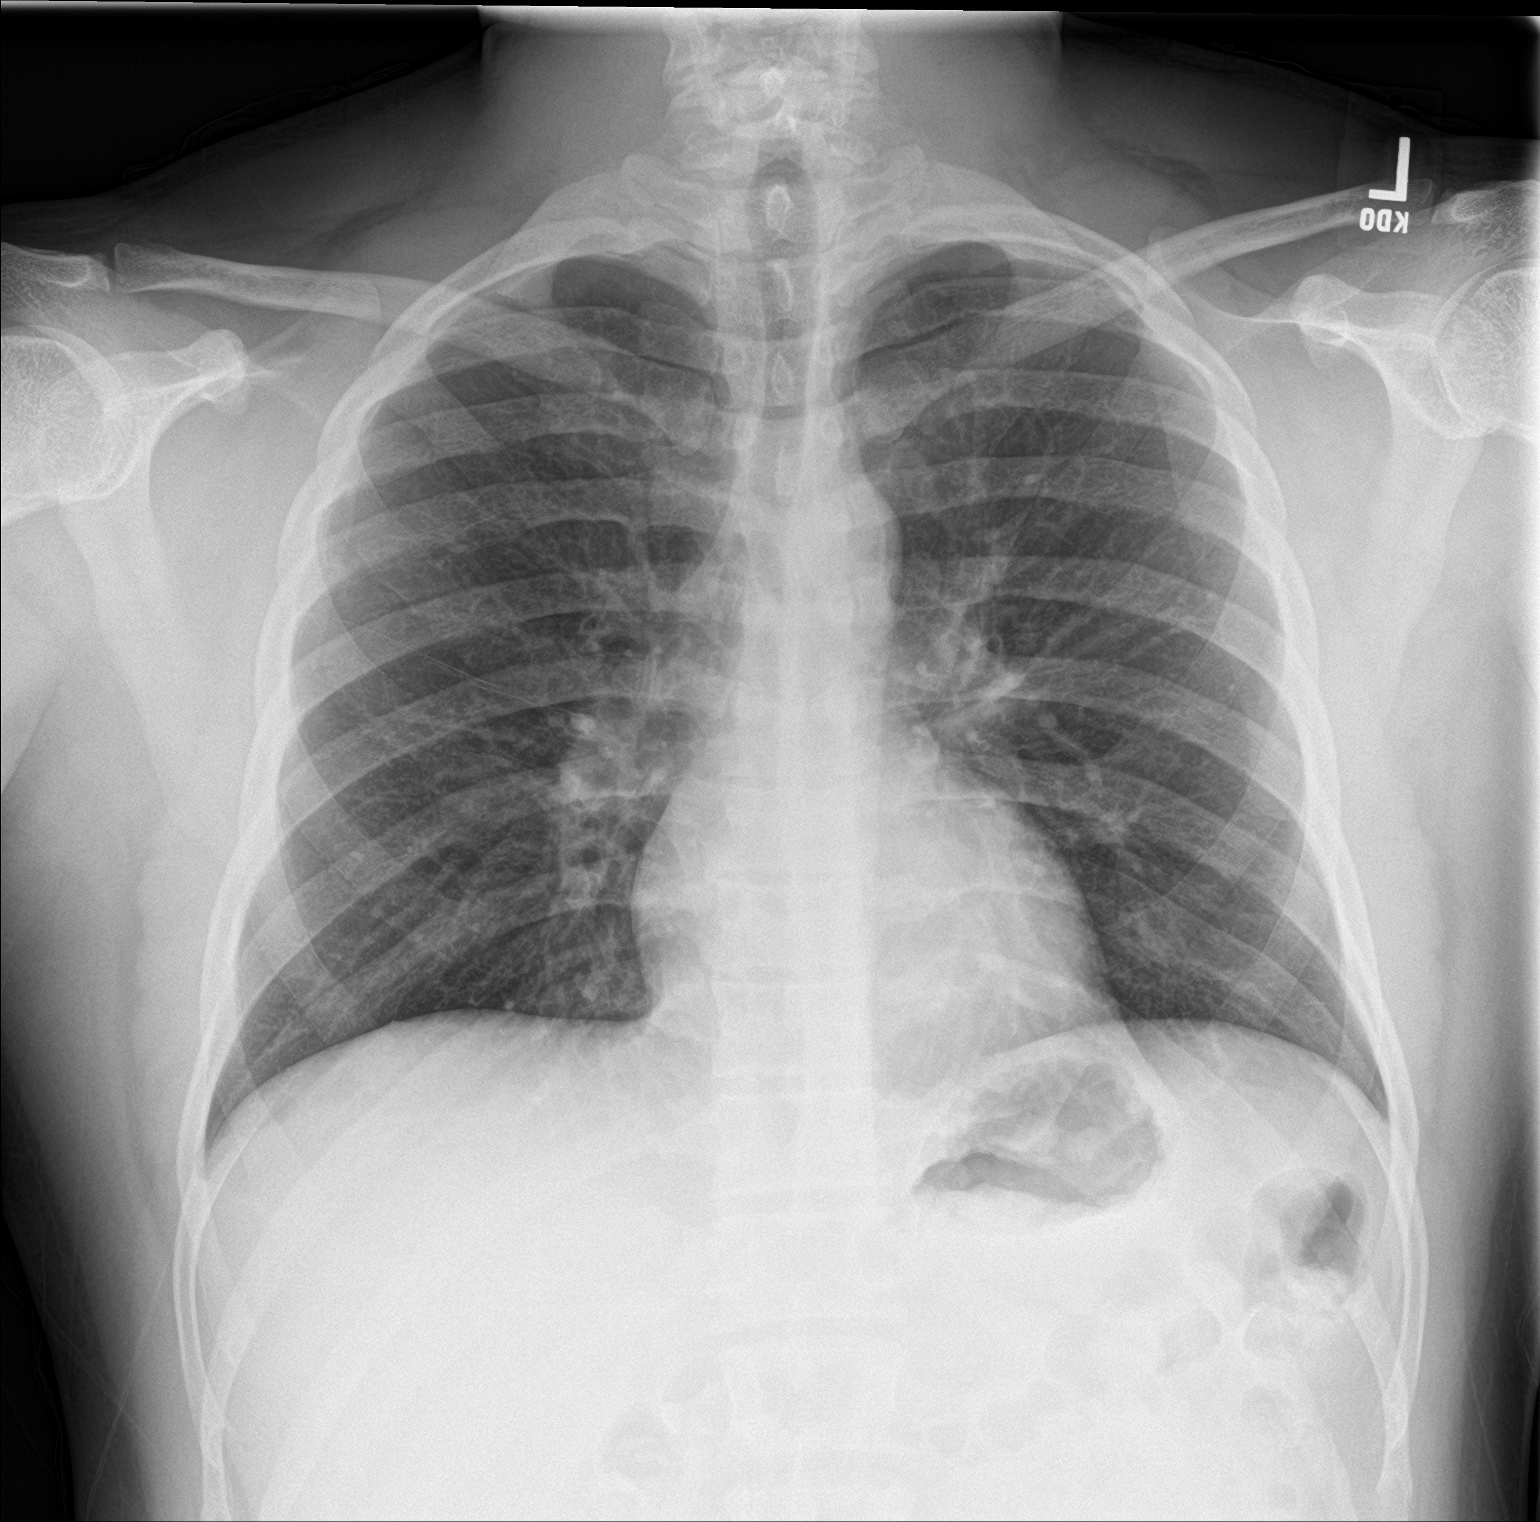

[chest lat]
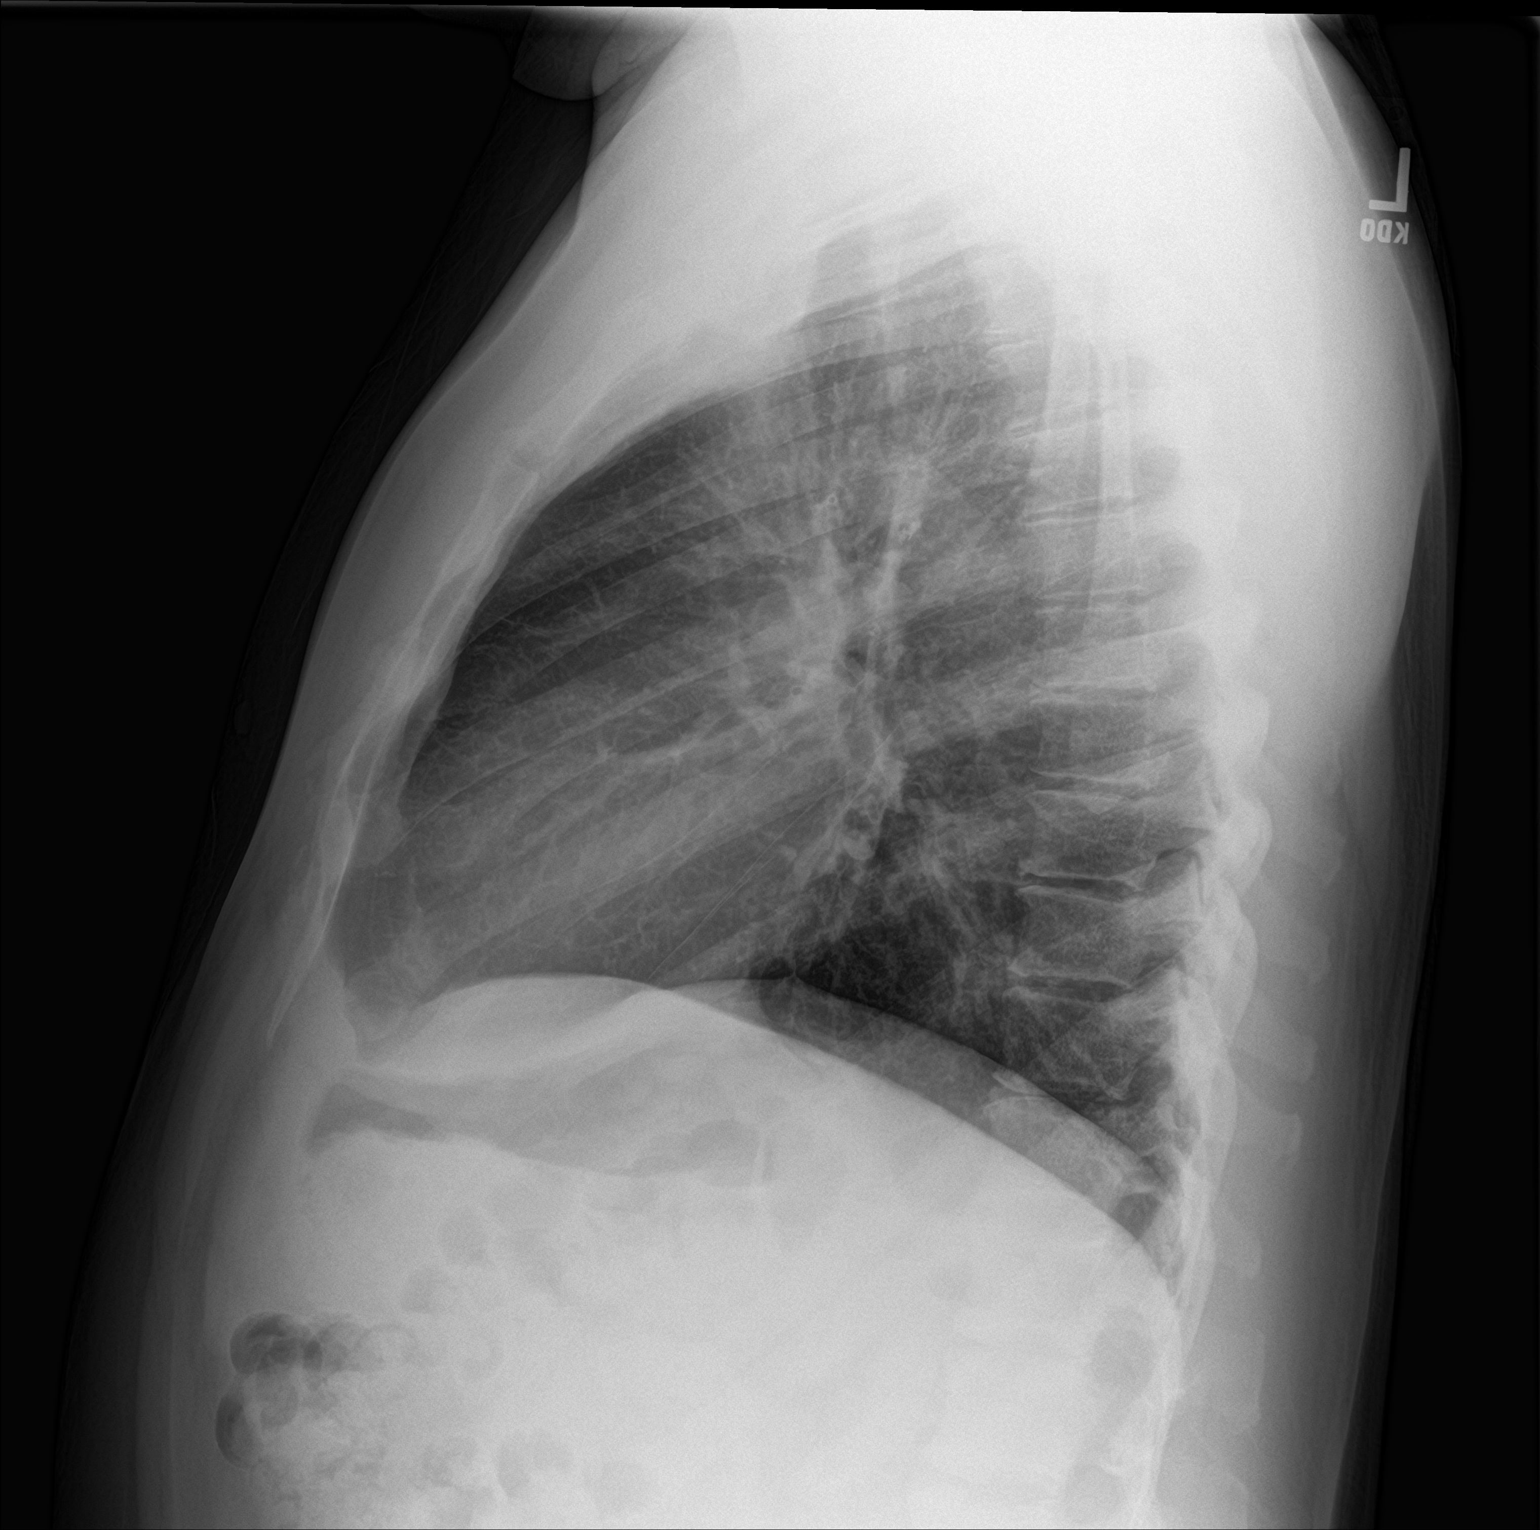

[2 of 2 positions shown; findings below may reference images not displayed]

FINDINGS: The heart size and mediastinal contours are within normal limits.
Both lungs are clear. The visualized skeletal structures are
unremarkable.
IMPRESSION: No active cardiopulmonary disease.

## 2018-11-04 ENCOUNTER — Encounter (HOSPITAL_COMMUNITY): Payer: Self-pay

## 2018-11-04 ENCOUNTER — Emergency Department (HOSPITAL_COMMUNITY)
Admission: EM | Admit: 2018-11-04 | Discharge: 2018-11-04 | Disposition: A | Discharge status: Home / Self Care | Payer: Medicare Other | Attending: Emergency Medicine | Admitting: Emergency Medicine

## 2018-11-04 DIAGNOSIS — Z79899 Other long term (current) drug therapy: Secondary | ICD-10-CM | POA: Diagnosis not present

## 2018-11-04 DIAGNOSIS — Z87891 Personal history of nicotine dependence: Secondary | ICD-10-CM | POA: Diagnosis not present

## 2018-11-04 DIAGNOSIS — L732 Hidradenitis suppurativa: Secondary | ICD-10-CM | POA: Diagnosis not present

## 2018-11-04 DIAGNOSIS — L02214 Cutaneous abscess of groin: Secondary | ICD-10-CM | POA: Insufficient documentation

## 2018-11-04 DIAGNOSIS — L0291 Cutaneous abscess, unspecified: Secondary | ICD-10-CM

## 2018-11-04 DIAGNOSIS — R222 Localized swelling, mass and lump, trunk: Secondary | ICD-10-CM | POA: Diagnosis present

## 2018-11-04 MED ORDER — OXYCODONE-ACETAMINOPHEN 5-325 MG PO TABS
1.0000 | ORAL_TABLET | Freq: Once | ORAL | Status: AC
  Administered 2018-11-04: 1 via ORAL
  Filled 2018-11-04: qty 1

## 2018-11-04 MED ORDER — OXYCODONE-ACETAMINOPHEN 5-325 MG PO TABS
1.0000 | ORAL_TABLET | Freq: Four times a day (QID) | ORAL | 0 refills | Status: DC | PRN
Start: 2018-11-04 — End: 2019-07-06

## 2018-11-04 MED ORDER — CLINDAMYCIN HCL 150 MG PO CAPS: 300.0000 mg | ORAL_CAPSULE | Freq: Three times a day (TID) | ORAL | 0 refills | Status: AC

## 2018-11-04 MED ORDER — CLINDAMYCIN HCL 150 MG PO CAPS
300.0000 mg | ORAL_CAPSULE | Freq: Once | ORAL | Status: AC
  Administered 2018-11-04: 300 mg via ORAL
  Filled 2018-11-04: qty 2

## 2018-11-04 MED ORDER — LIDOCAINE-EPINEPHRINE (PF) 2 %-1:200000 IJ SOLN
10.0000 mL | Freq: Once | INTRAMUSCULAR | Status: AC
  Administered 2018-11-04: 10 mL
  Filled 2018-11-04: qty 10

## 2018-11-04 NOTE — ED Provider Notes (Signed)
[facility] EMERGENCY DEPARTMENT Provider Note   CSN: [id number] Arrival date & time: 11/04/18  0505     History   Chief Complaint Chief Complaint  Patient presents with  . Abscess    HPI Logan Rodriguez is a 42 y.o. male.  Patient presents with painful swelling to left groin area starting about 3-4 days ago. History of recurrent cutaneous abscesses, differing locations. No fever, nausea. Pain does not affect scrotum. No recent or active drainage.   The history is provided by the patient. No language interpreter was used.    Past Medical History:  Diagnosis Date  . Abscess     Patient Active Problem List   Diagnosis Date Noted  . Abscess of perineum 07/15/2016  . Perineal abscess 12/11/2015    Past Surgical History:  Procedure Laterality Date  . INCISION AND DRAINAGE PERIRECTAL ABSCESS Left 12/11/2015   Procedure: IRRIGATION AND DEBRIDEMENT PERINEAL ABSCESS;  Surgeon: Chevis Pretty III, MD;  Location: [facility] OR;  Service: General;  Laterality: Left;  . IRRIGATION AND DEBRIDEMENT ABSCESS N/A 07/16/2016   Procedure: INCISION AND DRAINAGE AND OPEN PACKING PERINEAL AND SCROTAL ABSCESS;  Surgeon: Darnell Level, MD;  Location: [facility];  Service: General;  Laterality: N/A;        Home Medications    Prior to Admission medications   Medication Sig Start Date End Date Taking? Authorizing Provider  acetaminophen (TYLENOL) 325 MG tablet Take 2 tablets (650 mg total) by mouth every 6 (six) hours as needed for mild pain (or temp > 100). 07/17/16   Sherrie George, PA-C  benzonatate (TESSALON) 100 MG capsule Take 2 capsules (200 mg total) by mouth every 8 (eight) hours. 10/05/17   Khatri, Hina, PA-C  clindamycin (CLEOCIN) 300 MG capsule Take 1 capsule (300 mg total) by mouth 3 (three) times daily. Complete your course of antibiotics as instructed before admission. 07/17/16   Sherrie George, PA-C  HYDROcodone-acetaminophen (NORCO/VICODIN) 5-325 MG tablet Take 1-2  tablets by mouth every 4 (four) hours as needed for moderate pain. 07/17/16   Sherrie George, PA-C  ibuprofen (ADVIL,MOTRIN) 200 MG tablet Take 600 mg by mouth every 6 (six) hours as needed for headache, mild pain or moderate pain.    [provider]    Family History No family history on file.  Social History Social History   Tobacco Use  . Smoking status: Former Smoker    Types: Cigarettes  . Smokeless tobacco: Never Used  Substance Use Topics  . Alcohol use: Yes    Comment: occ  . Drug use: Yes    Frequency: 4.0 times per week    Types: Marijuana     Allergies   Doxycycline and Septra [sulfamethoxazole-trimethoprim]   Review of Systems Review of Systems  Constitutional: Negative for fever.  Gastrointestinal: Negative for abdominal pain and nausea.  Genitourinary: Negative for scrotal swelling and testicular pain.  Skin:       See HPI.     Physical Exam Updated Vital Signs BP (!) 164/96 (BP Location: Right Arm)   Pulse 85   Temp 98 F (36.7 C) (Oral)   Resp 17   Ht 5\' 8"  (1.727 m)   Wt 97.5 kg   SpO2 100%   BMI 32.69 kg/m   Physical Exam  Constitutional: He is oriented to person, place, and time. He appears well-developed and well-nourished.  Neck: Normal range of motion.  Pulmonary/Chest: Effort normal.  Genitourinary:     Genitourinary Comments: Nontender left testicle. Scrotum  nontender without erythema or induration.  Musculoskeletal: Normal range of motion.  Neurological: He is alert and oriented to person, place, and time.  Skin: Skin is warm and dry.  Large area of induration with mild erythema and central fluctuance at left groin. No active drainage.   Psychiatric: He has a normal mood and affect.     ED Treatments / Results  Labs (all labs ordered are listed, but only abnormal results are displayed) Labs Reviewed - No data to display  EKG None  Radiology No results found.  Procedures .Marland Kitchen.Incision and  Drainage Date/Time: 11/04/2018 7:08 AM Performed by: Elpidio AnisUpstill, Shavelle Runkel, PA-C Authorized by: Elpidio AnisUpstill, Brooklyne Radke, PA-C   Consent:    Consent obtained:  Verbal   Consent given by:  Patient Location:    Type:  Abscess   Location:  Anogenital   Anogenital location: Left groin across inguinal fold to thigh. Pre-procedure details:    Skin preparation:  Betadine Anesthesia (see MAR for exact dosages):    Anesthesia method:  Local infiltration   Local anesthetic:  Lidocaine 1% w/o epi Procedure type:    Complexity:  Complex Procedure details:    Incision type: Straight incision x 2, communicating.   Incision depth:  Dermal   Wound management:  Probed and deloculated   Drainage:  Bloody and purulent   Drainage amount:  Copious   Wound treatment:  Drain placed   Packing materials:  None Post-procedure details:    Patient tolerance of procedure:  Tolerated well, no immediate complications   (including critical care time)  Medications Ordered in ED Medications - No data to display   Initial Impression / Assessment and Plan / ED Course  I have reviewed the triage vital signs and the nursing notes.  Pertinent labs & imaging results that were available during my care of the patient were reviewed by me and considered in my medical decision making (see chart for details).     Patient presents with abscess to left groin. History of recurrent abscesses in pattern consistent with hidradenitis. No fever. He is nontoxic in appearance. VSS.   Dr. Eudelia Bunchardama has seen the patient and agrees I&D can be performed in the ED. Pain medication provided. He is started on Clindamycin (allergy to Septra, Doxycycline). Area I&D'd per above note.   Pain management for home (Percocet #12), Clindamycin (300 mg TID x 10d). Strongly encouraged 2 day recheck to have drain removed. Return precautions discussed that would prompt earlier return.   Patient is appropriate for discharge home.   Final Clinical  Impressions(s) / ED Diagnoses   Final diagnoses:  None   1. Groin abscess 2. Hidradenitis supurativa   ED Discharge Orders    None       Elpidio AnisUpstill, Shantale Holtmeyer, PA-C 11/04/18 [id number]    Nira Connardama, Pedro Eduardo, MD 11/04/18 [phone]

## 2018-11-04 NOTE — ED Notes (Signed)
Loose bandage placed ov er left groin drain and educated on care and removal as well as antibiotics.

## 2018-11-04 NOTE — ED Notes (Signed)
Provider at bedside

## 2018-11-04 NOTE — ED Triage Notes (Signed)
Patient c/o abscess LEFT inner thigh x 1 week. Patient reports similar abscess in the past "where they drained it and gave me antibiotics and then I was fine". Denies fever, nausea, vomiting, chest pain, chest pain, or abdominal pain.

## 2018-11-04 NOTE — Discharge Instructions (Addendum)
Return to the emergency department if you develop a fever, have severe or uncontrolled pain, or if symptoms of abscess worsen.   It is very important to have this rechecked in 2 days either here or with your primary care on Monday morning. Leave drain in place. Take the antibiotics as prescribed. Take Percocet for pain.

## 2019-07-06 ENCOUNTER — Emergency Department (HOSPITAL_COMMUNITY)
Admission: EM | Admit: 2019-07-06 | Discharge: 2019-07-06 | Disposition: A | Discharge status: Home / Self Care | Payer: Medicare Other | Attending: Emergency Medicine | Admitting: Emergency Medicine

## 2019-07-06 ENCOUNTER — Other Ambulatory Visit: Payer: Self-pay

## 2019-07-06 DIAGNOSIS — L02416 Cutaneous abscess of left lower limb: Secondary | ICD-10-CM

## 2019-07-06 DIAGNOSIS — Z87891 Personal history of nicotine dependence: Secondary | ICD-10-CM | POA: Insufficient documentation

## 2019-07-06 DIAGNOSIS — L539 Erythematous condition, unspecified: Secondary | ICD-10-CM | POA: Diagnosis present

## 2019-07-06 MED ORDER — AMOXICILLIN-POT CLAVULANATE 875-125 MG PO TABS: 1.0000 | ORAL_TABLET | Freq: Two times a day (BID) | ORAL | 0 refills | Status: DC

## 2019-07-06 MED ORDER — LIDOCAINE-EPINEPHRINE (PF) 2 %-1:200000 IJ SOLN
10.0000 mL | Freq: Once | INTRAMUSCULAR | Status: AC
  Administered 2019-07-06: 10 mL
  Filled 2019-07-06: qty 20

## 2019-07-06 NOTE — ED Notes (Signed)
Patient verbalizes understanding of discharge instructions . Opportunity for questions and answers were provided . Armband removed by staff ,Pt discharged from ED. W/C  offered at D/C  and Declined W/C at D/C and was escorted to lobby by RN.  

## 2019-07-06 NOTE — Discharge Instructions (Addendum)
Continue taking clindamycin.  Start taking Augmentin for the next week. Use Tylenol and ibuprofen as needed for pain. Wash once a day with soap and water.  Reapply new dressing after cleaning each day. Follow-up with your primary care doctor as needed for recheck. Return to the emergency room if you develop high fevers, severe worsening pain, or any new, worsening, or concerning symptoms.

## 2019-07-06 NOTE — ED Provider Notes (Signed)
[facility] EMERGENCY DEPARTMENT Provider Note   CSN: [id number] Arrival date & time: 07/06/19  1237     History   Chief Complaint Chief Complaint  Patient presents with  . Recurrent Skin Infections    HPI Logan Rodriguez is a 43 y.o. male presenting for evaluation of abscess.  Patient states he has had a gradually worsening area on his left leg.  It began about 6 days ago, and has become more painful.  There is nothing draining from it.  6 days ago he also started clindamycin for a scrotal abscess.  The scrotal abscess is improving, but his leg abscess is not.  He denies fevers, chills, nausea, vomiting.  He denies lesions elsewhere.  He has a history of recurrent abscesses.  He is not taking anything for pain including Tylenol or ibuprofen.  He is not on blood thinners.     HPI  Past Medical History:  Diagnosis Date  . Abscess     Patient Active Problem List   Diagnosis Date Noted  . Abscess of perineum 07/15/2016  . Perineal abscess 12/11/2015    Past Surgical History:  Procedure Laterality Date  . INCISION AND DRAINAGE PERIRECTAL ABSCESS Left 12/11/2015   Procedure: IRRIGATION AND DEBRIDEMENT PERINEAL ABSCESS;  Surgeon: Autumn Messing III, MD;  Location: [facility];  Service: General;  Laterality: Left;  . IRRIGATION AND DEBRIDEMENT ABSCESS N/A 07/16/2016   Procedure: INCISION AND DRAINAGE AND OPEN PACKING PERINEAL AND SCROTAL ABSCESS;  Surgeon: Armandina Gemma, MD;  Location: [facility];  Service: General;  Laterality: N/A;        Home Medications    Prior to Admission medications   Medication Sig Start Date End Date Taking? Authorizing Provider  acetaminophen (TYLENOL) 325 MG tablet Take 2 tablets (650 mg total) by mouth every 6 (six) hours as needed for mild pain (or temp > 100). 07/17/16  Yes Earnstine Regal, PA-C  clindamycin (CLEOCIN) 300 MG capsule Take 300 mg by mouth 3 (three) times daily. For 10 days 06/30/19 07/10/19 Yes [provider]   amoxicillin-clavulanate (AUGMENTIN) 875-125 MG tablet Take 1 tablet by mouth every 12 (twelve) hours. 07/06/19   Arah Aro, PA-C    Family History No family history on file.  Social History Social History   Tobacco Use  . Smoking status: Former Smoker    Types: Cigarettes  . Smokeless tobacco: Never Used  Substance Use Topics  . Alcohol use: Yes    Comment: occ  . Drug use: Yes    Frequency: 4.0 times per week    Types: Marijuana     Allergies   Doxycycline and Septra [sulfamethoxazole-trimethoprim]   Review of Systems Review of Systems  Constitutional: Negative for fever.  Skin: Positive for color change.       Abscess of left leg     Physical Exam Updated Vital Signs BP 134/83 (BP Location: Right Arm)   Pulse 79   Temp 99.2 F (37.3 C) (Oral)   Resp 16   Ht 5\' 8"  (1.727 m)   Wt 98.9 kg   SpO2 100%   BMI 33.15 kg/m   Physical Exam Vitals signs and nursing note reviewed.  Constitutional:      General: He is not in acute distress.    Appearance: He is well-developed.     Comments: Appears nontoxic  HENT:     Head: Normocephalic and atraumatic.  Neck:     Musculoskeletal: Normal range of motion.  Pulmonary:  Effort: Pulmonary effort is normal.  Abdominal:     General: There is no distension.  Musculoskeletal: Normal range of motion.  Skin:    General: Skin is warm.     Capillary Refill: Capillary refill takes less than 2 seconds.     Findings: No rash.     Comments: Erythema and induration of the lateral left leg with fluctuant abscess centrally.  No active drainage.  No streaking.  Neurological:     Mental Status: He is alert and oriented to person, place, and time.      ED Treatments / Results  Labs (all labs ordered are listed, but only abnormal results are displayed) Labs Reviewed  AEROBIC CULTURE (SUPERFICIAL SPECIMEN)    EKG None  Radiology No results found.  Procedures .Marland Kitchen.Incision and Drainage  Date/Time:  07/06/2019 4:05 PM Performed by: Alveria Apleyaccavale, Karry Barrilleaux, PA-C Authorized by: Alveria Apleyaccavale, Sherryl Valido, PA-C   Consent:    Consent obtained:  Verbal   Consent given by:  Patient   Risks discussed:  Bleeding, damage to other organs, incomplete drainage, infection and pain Location:    Type:  Abscess   Location:  Lower extremity   Lower extremity location:  Leg   Leg location:  L upper leg Pre-procedure details:    Skin preparation:  Betadine Anesthesia (see MAR for exact dosages):    Anesthesia method:  Local infiltration   Local anesthetic:  Lidocaine 2% WITH epi Procedure type:    Complexity:  Simple Procedure details:    Incision types:  Single straight   Incision depth:  Dermal   Scalpel blade:  11   Wound management:  Probed and deloculated and irrigated with saline   Drainage:  Bloody and purulent   Drainage amount:  Moderate   Wound treatment:  Wound left open   Packing materials:  None Post-procedure details:    Patient tolerance of procedure:  Tolerated well, no immediate complications   (including critical care time)  Medications Ordered in ED Medications  lidocaine-EPINEPHrine (XYLOCAINE W/EPI) 2 %-1:200000 (PF) injection 10 mL (10 mLs Infiltration Given 07/06/19 1350)     Initial Impression / Assessment and Plan / ED Course  I have reviewed the triage vital signs and the nursing notes.  Pertinent labs & imaging results that were available during my care of the patient were reviewed by me and considered in my medical decision making (see chart for details).        Patient presenting for evaluation of abscess in the leg.  Physical examination, he appears nontoxic.  No signs of systemic infection.  However, this has continued to worsen despite being on clindamycin.  Additionally, patient allergic to doxycycline and Bactrim, severely limiting her options for abscess treatment in an outpatient setting.  I discussed with pharmacy, who recommends Augmentin.  I&D performed as  described above.  Obtained superficial wound culture.  Unable to obtain anaerobic wound culture as swab could not be found.  Will have patient continue taking Clinda as scrotal abscess is improving, will add on Augmentin, mostly for the surrounding cellulitis.  Discussed wound care for the I&D.  At this time, patient appears safe for discharge.  Return precautions given.  Patient states he understands and agrees to plan.  Final Clinical Impressions(s) / ED Diagnoses   Final diagnoses:  Abscess of left leg    ED Discharge Orders         Ordered    amoxicillin-clavulanate (AUGMENTIN) 875-125 MG tablet  Every 12 hours  07/06/19 1515           Alveria ApleyCaccavale, Dangela How, PA-C 07/06/19 1607    Raeford RazorKohut, Stephen, MD 07/07/19 1105

## 2019-07-06 NOTE — ED Triage Notes (Signed)
Pt reports a second  Abscess  Developed on Lt leg. Pt  Reports he is still taking anti-BX  From 1st abscess

## 2019-07-11 LAB — AEROBIC CULTURE? (SUPERFICIAL SPECIMEN)

## 2019-07-11 LAB — AEROBIC CULTURE W GRAM STAIN (SUPERFICIAL SPECIMEN): Special Requests: NORMAL

## 2019-07-12 ENCOUNTER — Telehealth: Payer: Self-pay | Admitting: Emergency Medicine

## 2019-07-12 NOTE — Telephone Encounter (Signed)
Post ED Visit - Positive Culture Follow-up  Culture report reviewed by antimicrobial stewardship pharmacist: [place] Team []  Elenor Quinones, Pharm.D. []  Heide Guile, Pharm.D., BCPS AQ-ID []  Parks Neptune, Pharm.D., BCPS []  Alycia Rossetti, Pharm.D., BCPS []  Goff, [place].D., BCPS, AAHIVP []  Legrand Como, Pharm.D., BCPS, AAHIVP []  Salome Arnt, PharmD, BCPS []  Johnnette Gourd, PharmD, BCPS []  Hughes Better, PharmD, BCPS []  Leeroy Cha, PharmD []  Laqueta Linden, PharmD, BCPS []  Albertina Parr, PharmD Elicia Lamp pharmD  [place] Team []  Leodis Sias, PharmD []  Lindell Spar, PharmD []  Royetta Asal, PharmD []  Graylin Shiver, Rph []  Rema Fendt) Glennon Mac, PharmD []  Arlyn Dunning, PharmD []  Netta Cedars, PharmD []  Dia Sitter, PharmD []  Leone Haven, PharmD []  Gretta Arab, PharmD []  Theodis Shove, PharmD []  Peggyann Juba, PharmD []  Reuel Boom, PharmD   Positive wound culture Treated with amoxicillin and clindamycin, organism sensitive to the same and no further patient follow-up is required at this time.  Hazle Nordmann 07/12/2019, 11:49 AM

## 2021-07-06 ENCOUNTER — Other Ambulatory Visit: Payer: Self-pay

## 2021-07-06 ENCOUNTER — Emergency Department (HOSPITAL_COMMUNITY)
Admission: EM | Admit: 2021-07-06 | Discharge: 2021-07-06 | Disposition: A | Discharge status: Home / Self Care | Payer: Medicare Other | Attending: Emergency Medicine | Admitting: Emergency Medicine

## 2021-07-06 ENCOUNTER — Encounter (HOSPITAL_COMMUNITY): Payer: Self-pay | Admitting: Emergency Medicine

## 2021-07-06 DIAGNOSIS — L02214 Cutaneous abscess of groin: Secondary | ICD-10-CM | POA: Insufficient documentation

## 2021-07-06 DIAGNOSIS — Z87891 Personal history of nicotine dependence: Secondary | ICD-10-CM | POA: Diagnosis not present

## 2021-07-06 DIAGNOSIS — L0291 Cutaneous abscess, unspecified: Secondary | ICD-10-CM

## 2021-07-06 MED ORDER — LINEZOLID 600 MG PO TABS
600.0000 mg | ORAL_TABLET | Freq: Two times a day (BID) | ORAL | Status: DC
  Administered 2021-07-06: 600 mg via ORAL
  Filled 2021-07-06: qty 1

## 2021-07-06 MED ORDER — HYDROCODONE-ACETAMINOPHEN 5-325 MG PO TABS
1.0000 | ORAL_TABLET | Freq: Once | ORAL | Status: AC
  Administered 2021-07-06: 1 via ORAL
  Filled 2021-07-06: qty 1

## 2021-07-06 MED ORDER — LIDOCAINE HCL 2 % IJ SOLN
10.0000 mL | Freq: Once | INTRAMUSCULAR | Status: DC
  Filled 2021-07-06: qty 20

## 2021-07-06 MED ORDER — LINEZOLID 600 MG PO TABS: 600.0000 mg | ORAL_TABLET | Freq: Two times a day (BID) | ORAL | 0 refills | Status: AC

## 2021-07-06 MED ORDER — ONDANSETRON 4 MG PO TBDP
4.0000 mg | ORAL_TABLET | Freq: Once | ORAL | Status: AC
  Administered 2021-07-06: 4 mg via ORAL
  Filled 2021-07-06: qty 1

## 2021-07-06 NOTE — Progress Notes (Signed)
No IV needed per bedside RN. Will cancel IV request. IV team will remain available as needed.

## 2021-07-06 NOTE — ED Provider Notes (Signed)
[facility] EMERGENCY DEPARTMENT Provider Note   CSN: [id number] Arrival date & time: 07/06/21  1752     History Chief Complaint  Patient presents with   Abscess    Logan Rodriguez is a 45 y.o. male with PMH/o perineal abscess who presents for evaluation of redness, swelling, pain noted to his right groin that has been ongoing for the last 4 to 5 days.  He reports that he started noticing initially had felt a small bump there.  He states since then it is gotten worse and become progressively larger, warm, erythematous.  He has a history of abscesses and recently was treated to an abscess on his left groin.  He was saw his primary care doctor on 06/26/2021 where he was treated for an abscess and started on clindamycin.  He reports finishing the clindamycin.  He has not had any fevers.  He states that the pain, redness, swelling does not extend to his testicles and has not had any pain, warmth, swelling of his testicles or penis.  He denies any pain to his perineum or rectum.  He denies any fever.  No history of diabetes.  No history of HIV. No history of IV drug use.   The history is provided by the patient.      Past Medical History:  Diagnosis Date   Abscess     Patient Active Problem List   Diagnosis Date Noted   Abscess of perineum 07/15/2016   Perineal abscess 12/11/2015    Past Surgical History:  Procedure Laterality Date   INCISION AND DRAINAGE PERIRECTAL ABSCESS Left 12/11/2015   Procedure: IRRIGATION AND DEBRIDEMENT PERINEAL ABSCESS;  Surgeon: Chevis Pretty III, MD;  Location: [facility] OR;  Service: General;  Laterality: Left;   IRRIGATION AND DEBRIDEMENT ABSCESS N/A 07/16/2016   Procedure: INCISION AND DRAINAGE AND OPEN PACKING PERINEAL AND SCROTAL ABSCESS;  Surgeon: Darnell Level, MD;  Location: [facility] ORS;  Service: General;  Laterality: N/A;       No family history on file.  Social History   Tobacco Use   Smoking status: Former    Types: Cigarettes    Smokeless tobacco: Never  Substance Use Topics   Alcohol use: Yes    Comment: occ   Drug use: Yes    Frequency: 4.0 times per week    Types: Marijuana    Home Medications Prior to Admission medications   Medication Sig Start Date End Date Taking? Authorizing Provider  linezolid (ZYVOX) 600 MG tablet Take 1 tablet (600 mg total) by mouth 2 (two) times daily for 7 days. 07/06/21 07/13/21 Yes Maxwell Caul, PA-C  acetaminophen (TYLENOL) 325 MG tablet Take 2 tablets (650 mg total) by mouth every 6 (six) hours as needed for mild pain (or temp > 100). 07/17/16   Sherrie George, PA-C  amoxicillin-clavulanate (AUGMENTIN) 875-125 MG tablet Take 1 tablet by mouth every 12 (twelve) hours. 07/06/19   Caccavale, Sophia, PA-C    Allergies    Doxycycline and Septra [sulfamethoxazole-trimethoprim]  Review of Systems   Review of Systems  Constitutional:  Negative for fever.  Genitourinary:  Negative for penile pain, penile swelling, scrotal swelling and testicular pain.  Skin:  Positive for color change and wound.  Neurological:  Negative for headaches.  All other systems reviewed and are negative.  Physical Exam Updated Vital Signs BP (!) 176/73   Pulse 74   Temp 99.1 F (37.3 C)   Resp 18   SpO2 98%   Physical Exam  Vitals and nursing note reviewed.  Constitutional:      Appearance: He is well-developed.  HENT:     Head: Normocephalic and atraumatic.  Eyes:     General: No scleral icterus.       Right eye: No discharge.        Left eye: No discharge.     Conjunctiva/sclera: Conjunctivae normal.  Pulmonary:     Effort: Pulmonary effort is normal.  Genitourinary:      Comments: The exam was performed with a chaperone present. Normal male genitalia. No evidence of rash, ulcers or lesions.  No warmth, erythema noted to bilateral testicles.  No crepitus noted.  No pain to the perineum. Skin:    General: Skin is warm and dry.  Neurological:     Mental Status: He is alert.   Psychiatric:        Speech: Speech normal.        Behavior: Behavior normal.    ED Results / Procedures / Treatments   Labs (all labs ordered are listed, but only abnormal results are displayed) Labs Reviewed  AEROBIC CULTURE W GRAM STAIN (SUPERFICIAL SPECIMEN)    EKG None  Radiology No results found.  Procedures .Marland KitchenIncision and Drainage  Date/Time: 07/06/2021 10:05 PM Performed by: Maxwell Caul, PA-C Authorized by: Maxwell Caul, PA-C   Consent:    Consent obtained:  Verbal   Consent given by:  Patient   Risks discussed:  Bleeding, incomplete drainage, pain and damage to other organs   Alternatives discussed:  No treatment Universal protocol:    Procedure explained and questions answered to patient or proxy's satisfaction: yes     Relevant documents present and verified: yes     Test results available : yes     Imaging studies available: yes     Required blood products, implants, devices, and special equipment available: yes     Site/side marked: yes     Immediately prior to procedure, a time out was called: yes     Patient identity confirmed:  Verbally with patient Location:    Type:  Abscess   Location:  Anogenital   Anogenital location: right inner groin. Pre-procedure details:    Skin preparation:  Betadine Anesthesia:    Anesthesia method:  Local infiltration   Local anesthetic:  Lidocaine 2% w/o epi Procedure type:    Complexity:  Complex Procedure details:    Incision types:  Single straight   Incision depth:  Subcutaneous   Wound management:  Probed and deloculated, irrigated with saline and extensive cleaning   Drainage:  Purulent   Drainage amount:  Moderate   Packing materials:  1/4 in gauze and 1/4 in iodoform gauze Post-procedure details:    Procedure completion:  Tolerated well, no immediate complications   Medications Ordered in ED Medications  lidocaine (XYLOCAINE) 2 % (with pres) injection 200 mg (has no administration in time  range)  linezolid (ZYVOX) tablet 600 mg (600 mg Oral Given 07/06/21 2304)  HYDROcodone-acetaminophen (NORCO/VICODIN) 5-325 MG per tablet 1 tablet (1 tablet Oral Given 07/06/21 2210)  ondansetron (ZOFRAN-ODT) disintegrating tablet 4 mg (4 mg Oral Given 07/06/21 2210)    ED Course  I have reviewed the triage vital signs and the nursing notes.  Pertinent labs & imaging results that were available during my care of the patient were reviewed by me and considered in my medical decision making (see chart for details).    MDM Rules/Calculators/A&P  45 year old male who presents for evaluation of pain, redness, swelling noted to his inner right thigh.  He has a history of this and was recently treated with clindamycin for an abscess on his left thigh.  No fevers, history of diabetes, history of HIV, history of IV drug use.  On initially arrival, he is afebrile, toxic appearing.  Vital signs are stable.  On exam, he does have an area of about 1 cm that is fluctuant in the inner right thigh with surrounding warmth, erythema, induration.  Clinically concern for cellulitis and abscess.  He has no crepitus.  No tenderness palpation noted to testicles or perineum.  History/physical exam not concerning for Fournier's gangrene.  I reviewed his records.  He has been on Augmentin for this before no improvement.  He recently saw his PCP on 7/7 for evaluation of a left-sided abscess.  At that time, he was started on a course of clindamycin which he just finished.  Bedside ultrasound applied which did identify an abscess about 1 x 1.5 cm.  There was some surrounding cobblestoning concerning for cellulitis.  We will plan for I&D.  ID as document above.  Patient tolerated procedure well.  I discussed with pharmacist Valrie Hart.).  Given the patient has already been on clindamycin, he recommends starting patient on linezolid, 600 mg twice daily for 7 days for staph coverage.  Given that patient  is allergic to sulfa and Doxy, he does not have any other potential options that would treat staph.  I have put in a transition of care consult to see if we can help him with this medication and have given him dose here in the ED.  Patient given first dose of antibiotics here tonight.  He is aware of plan.  At this time, patient is afebrile, nontoxic-appearing. At this time, patient exhibits no emergent life-threatening condition that require further evaluation in ED. Patient had ample opportunity for questions and discussion. All patient's questions were answered with full understanding. Strict return precautions discussed. Patient expresses understanding and agreement to plan.    EMERGENCY DEPARTMENT US SOFT TISSUE INTERPRETATION "Study: Limited Soft Tissue Ultrasound"  INDICATIONS: Pain and Soft tissue infection Multiple views of the body part were obtained in real-time with a multi-frequency linear probe  PERFORMED BY: Myself IMAGES ARCHIVED?: Yes SIDE:Right  BODY PART: Groin/inner thigh INTERPRETATION:  Abcess present and Cellulitis present  Portions of this note were generated with Dragon dictation software. Dictation errors may occur despite best attempts at proofreading.   Final Clinical Impression(s) / ED Diagnoses Final diagnoses:  Abscess    Rx / DC Orders ED Discharge Orders          Ordered    linezolid (ZYVOX) 600 MG tablet  2 times daily        07/06/21 2231             Rosana Hoes 07/06/21 2335    Terald Sleeper, MD 07/07/21 1108

## 2021-07-06 NOTE — ED Triage Notes (Signed)
Patient from home, complaint of abscess to right groin.

## 2021-07-06 NOTE — Discharge Instructions (Addendum)
You can take Tylenol or Ibuprofen as directed for pain. You can alternate Tylenol and Ibuprofen every 4 hours. If you take Tylenol at 1pm, then you can take Ibuprofen at 5pm. Then you can take Tylenol again at 9pm.   Take antibiotics as directed. Please take all of your antibiotics until finished. There is a coupon attached to the back of your paperwork.  Please follow-up with the ED or your your primary care doctor in 2 to 3 days to have the wound recheck and packing removed.  Return to emergency room for any worsening pain, redness or swelling that begins to spread, fevers or any other worsening concerning symptoms.

## 2021-07-09 LAB — AEROBIC CULTURE W GRAM STAIN (SUPERFICIAL SPECIMEN): Culture: NORMAL

## 2021-07-10 ENCOUNTER — Telehealth: Payer: Self-pay | Admitting: *Deleted

## 2021-07-10 NOTE — Telephone Encounter (Signed)
Post ED Visit - Positive Culture Follow-up  Culture report reviewed by antimicrobial stewardship pharmacist: [facility] []  , Pharm.D. []  Enzo Bi, Pharm.D., BCPS AQ-ID []  , Pharm.D., BCPS []  Celedonio Miyamoto, Pharm.D., BCPS []  [place], Garvin Fila.D., BCPS, AAHIVP []  , Pharm.D., BCPS, AAHIVP [x]  Georgina Pillion, PharmD, BCPS []  , PharmD, BCPS []  [place], PharmD, BCPS []  [address], PharmD []  , PharmD, BCPS []  Estella Husk, PharmD  Pharmacy Team []  Lysle Pearl, PharmD []  , PharmD []  Phillips Climes, PharmD []  , Rph []  Agapito Games) , PharmD []  Verlan Friends, PharmD []  , PharmD []  Mervyn Gay, PharmD []  , PharmD []  Vinnie Level, PharmD []  [facility], PharmD []  , PharmD []  Len Childs, PharmD   Positive wound culture Treated with Linezolid, organism sensitive to the same and no further patient follow-up is required at this time.  [facility] 07/10/2021, 11:11 AM

## 2021-12-03 NOTE — H&P (Signed)
HISTORY AND PHYSICAL  Logan Rodriguez is a 45 y.o. male patient with CC: No pain.Referred by general dentist for extractions.  No diagnosis found.  Past Medical History:  Diagnosis Date   Abscess     No current facility-administered medications for this encounter.   Current Outpatient Medications  Medication Sig Dispense Refill   acetaminophen (TYLENOL) 325 MG tablet Take 2 tablets (650 mg total) by mouth every 6 (six) hours as needed for mild pain (or temp > 100).     amoxicillin-clavulanate (AUGMENTIN) 875-125 MG tablet Take 1 tablet by mouth every 12 (twelve) hours. 14 tablet 0   Allergies  Allergen Reactions   Doxycycline Swelling   Septra [Sulfamethoxazole-Trimethoprim] Swelling   Active Problems:   * No active hospital problems. *  Vitals: There were no vitals taken for this visit. Lab results:No results found for this or any previous visit (from the past 24 hour(s)). Radiology Results: No results found. General appearance: alert, cooperative, no distress, and mildly obese Head: Normocephalic, without obvious abnormality, atraumatic Eyes: conjunctivae/corneas clear. PERRL, EOM's intact. Fundi benign. Nose: Nares normal. Septum midline. Mucosa normal. No drainage or sinus tenderness. Throat: Erupted teeth 1, 16, 18, with decay. 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  Impacted teeth # 17 and 32. Pharynx clear. Neck: no adenopathy Resp: clear to auscultation bilaterally Cardio: regular rate and rhythm, S1, S2 normal, no murmur, click, rub or gallop  Assessment:Non-restorable teeth 1, 16, 18 secondary to dental caries, impacted teeth 17 and 32.  Plan: Extraction 1, 16, 17, 18, 32. GA, Day surgery   Ocie Doyne 12/03/2021

## 2021-12-05 ENCOUNTER — Encounter (HOSPITAL_COMMUNITY): Payer: Self-pay | Admitting: Oral Surgery

## 2021-12-05 ENCOUNTER — Other Ambulatory Visit: Payer: Self-pay

## 2021-12-05 NOTE — Progress Notes (Signed)
PCP - Mady Gemma, PA Cardiologist - denies EKG -  Chest x-ray -  ECHO -  Cardiac Cath -  CPAP -   COVID TEST- n/a  Anesthesia review: n/a  -------------  SDW INSTRUCTIONS:  Your procedure is scheduled on 12/19. Please report to [facility] Main Entrance "A" at 06:30 A.M., and check in at the Admitting office. Call this number if you have problems the morning of surgery: [phone]   Remember: Do not eat or drink after midnight the night before your surgery   Medications to take morning of surgery with a sip of water include: Tylenol if needed  As of today, STOP taking any Aspirin (unless otherwise instructed by your surgeon), Aleve, Naproxen, Ibuprofen, Motrin, Advil, Goody's, BC's, all herbal medications, fish oil, and all vitamins.    The Morning of Surgery Do not wear jewelry Do not wear lotions, powders, or perfumes/colognes, or deodorant  Do not bring valuables to the hospital. [facility] is not responsible for any belongings or valuables.  If you are a smoker, DO NOT Smoke 24 hours prior to surgery  If you wear a CPAP at night please bring your mask the morning of surgery   Remember that you must have someone to transport you home after your surgery, and remain with you for 24 hours if you are discharged the same day.  Please bring cases for contacts, glasses, hearing aids, dentures or bridgework because it cannot be worn into surgery.   Patients discharged the day of surgery will not be allowed to drive home.   Please shower the NIGHT BEFORE/MORNING OF SURGERY (use antibacterial soap like DIAL soap if possible). Wear comfortable clothes the morning of surgery. Oral Hygiene is also important to reduce your risk of infection.  Remember - BRUSH YOUR TEETH THE MORNING OF SURGERY WITH YOUR REGULAR TOOTHPASTE  Patient denies shortness of breath, fever, cough and chest pain.

## 2021-12-08 ENCOUNTER — Other Ambulatory Visit: Payer: Self-pay

## 2021-12-08 ENCOUNTER — Ambulatory Visit (HOSPITAL_COMMUNITY)
Admission: RE | Admit: 2021-12-08 | Discharge: 2021-12-08 | Disposition: A | Discharge status: Home / Self Care | Payer: Medicare Other | Attending: Oral Surgery | Admitting: Oral Surgery

## 2021-12-08 ENCOUNTER — Encounter (HOSPITAL_COMMUNITY)
Admission: RE | Disposition: A | Discharge status: Home / Self Care | Payer: Self-pay | Source: Home / Self Care | Attending: Oral Surgery

## 2021-12-08 ENCOUNTER — Encounter (HOSPITAL_COMMUNITY): Payer: Self-pay | Admitting: Oral Surgery

## 2021-12-08 ENCOUNTER — Ambulatory Visit (HOSPITAL_COMMUNITY): Payer: Medicare Other | Admitting: Certified Registered"

## 2021-12-08 DIAGNOSIS — Z87891 Personal history of nicotine dependence: Secondary | ICD-10-CM | POA: Insufficient documentation

## 2021-12-08 DIAGNOSIS — K011 Impacted teeth: Secondary | ICD-10-CM | POA: Diagnosis not present

## 2021-12-08 DIAGNOSIS — K029 Dental caries, unspecified: Secondary | ICD-10-CM | POA: Insufficient documentation

## 2021-12-08 HISTORY — PX: TOOTH EXTRACTION: SHX859

## 2021-12-08 LAB — CBC
HCT: 41.3 % (ref 39.0–52.0)
Hemoglobin: 13.4 g/dL (ref 13.0–17.0)
MCH: 29.4 pg (ref 26.0–34.0)
MCHC: 32.4 g/dL (ref 30.0–36.0)
MCV: 90.6 fL (ref 80.0–100.0)
Platelets: 277 10*3/uL (ref 150–400)
RBC: 4.56 MIL/uL (ref 4.22–5.81)
RDW: 14.3 % (ref 11.5–15.5)
WBC: 7 10*3/uL (ref 4.0–10.5)
nRBC: 0 % (ref 0.0–0.2)

## 2021-12-08 SURGERY — DENTAL RESTORATION/EXTRACTIONS
Anesthesia: General | Site: Mouth

## 2021-12-08 MED ORDER — HYDROCODONE-ACETAMINOPHEN 5-325 MG PO TABS: 1.0000 | ORAL_TABLET | Freq: Four times a day (QID) | ORAL | 0 refills | Status: DC | PRN

## 2021-12-08 MED ORDER — HYDRALAZINE HCL 20 MG/ML IJ SOLN
5.0000 mg | Freq: Once | INTRAMUSCULAR | Status: AC
  Administered 2021-12-08: 12:00:00 5 mg via INTRAVENOUS

## 2021-12-08 MED ORDER — AMOXICILLIN 500 MG PO CAPS: 500.0000 mg | ORAL_CAPSULE | Freq: Three times a day (TID) | ORAL | 0 refills | Status: DC

## 2021-12-08 MED ORDER — CEFAZOLIN SODIUM-DEXTROSE 2-3 GM-%(50ML) IV SOLR
INTRAVENOUS | Status: DC | PRN
  Administered 2021-12-08: 2 g via INTRAVENOUS

## 2021-12-08 MED ORDER — PROPOFOL 10 MG/ML IV BOLUS
INTRAVENOUS | Status: AC
  Filled 2021-12-08: qty 20

## 2021-12-08 MED ORDER — FENTANYL CITRATE (PF) 250 MCG/5ML IJ SOLN
INTRAMUSCULAR | Status: AC
  Filled 2021-12-08: qty 5

## 2021-12-08 MED ORDER — ROCURONIUM BROMIDE 100 MG/10ML IV SOLN
INTRAVENOUS | Status: DC | PRN
  Administered 2021-12-08: 50 mg via INTRAVENOUS

## 2021-12-08 MED ORDER — FENTANYL CITRATE (PF) 100 MCG/2ML IJ SOLN
INTRAMUSCULAR | Status: DC | PRN
  Administered 2021-12-08: 100 ug via INTRAVENOUS

## 2021-12-08 MED ORDER — SUGAMMADEX SODIUM 200 MG/2ML IV SOLN
INTRAVENOUS | Status: DC | PRN
  Administered 2021-12-08: 200 mg via INTRAVENOUS

## 2021-12-08 MED ORDER — LIDOCAINE-EPINEPHRINE 2 %-1:100000 IJ SOLN
INTRAMUSCULAR | Status: DC | PRN
  Administered 2021-12-08: 20 mL via INTRADERMAL

## 2021-12-08 MED ORDER — CEFAZOLIN SODIUM-DEXTROSE 2-4 GM/100ML-% IV SOLN
2.0000 g | INTRAVENOUS | Status: DC
  Filled 2021-12-08: qty 100

## 2021-12-08 MED ORDER — MIDAZOLAM HCL 2 MG/2ML IJ SOLN
INTRAMUSCULAR | Status: AC
  Filled 2021-12-08: qty 2

## 2021-12-08 MED ORDER — KETOROLAC TROMETHAMINE 30 MG/ML IJ SOLN
INTRAMUSCULAR | Status: DC | PRN
  Administered 2021-12-08: 30 mg via INTRAVENOUS

## 2021-12-08 MED ORDER — LACTATED RINGERS IV SOLN: INTRAVENOUS | Status: DC

## 2021-12-08 MED ORDER — ONDANSETRON HCL 4 MG/2ML IJ SOLN
INTRAMUSCULAR | Status: DC | PRN
  Administered 2021-12-08: 4 mg via INTRAVENOUS

## 2021-12-08 MED ORDER — 0.9 % SODIUM CHLORIDE (POUR BTL) OPTIME
TOPICAL | Status: DC | PRN
  Administered 2021-12-08: 10:00:00 1000 mL

## 2021-12-08 MED ORDER — LIDOCAINE-EPINEPHRINE 2 %-1:100000 IJ SOLN
INTRAMUSCULAR | Status: AC
  Filled 2021-12-08: qty 1

## 2021-12-08 MED ORDER — HYDROMORPHONE HCL 1 MG/ML IJ SOLN: 0.2500 mg | INTRAMUSCULAR | Status: DC | PRN

## 2021-12-08 MED ORDER — HYDRALAZINE HCL 20 MG/ML IJ SOLN
5.0000 mg | Freq: Once | INTRAMUSCULAR | Status: AC
  Administered 2021-12-08: 11:00:00 5 mg via INTRAVENOUS

## 2021-12-08 MED ORDER — ORAL CARE MOUTH RINSE: 15.0000 mL | Freq: Once | OROMUCOSAL | Status: AC

## 2021-12-08 MED ORDER — CHLORHEXIDINE GLUCONATE 0.12 % MT SOLN
15.0000 mL | Freq: Once | OROMUCOSAL | Status: AC
  Administered 2021-12-08: 07:00:00 15 mL via OROMUCOSAL
  Filled 2021-12-08: qty 15

## 2021-12-08 MED ORDER — HYDRALAZINE HCL 20 MG/ML IJ SOLN
INTRAMUSCULAR | Status: AC
  Filled 2021-12-08: qty 1

## 2021-12-08 MED ORDER — DEXAMETHASONE SODIUM PHOSPHATE 4 MG/ML IJ SOLN
INTRAMUSCULAR | Status: DC | PRN
  Administered 2021-12-08: 10 mg via INTRAVENOUS

## 2021-12-08 MED ORDER — PROPOFOL 10 MG/ML IV BOLUS
INTRAVENOUS | Status: DC | PRN
  Administered 2021-12-08: 50 mg via INTRAVENOUS
  Administered 2021-12-08: 200 mg via INTRAVENOUS

## 2021-12-08 MED ORDER — SODIUM CHLORIDE 0.9 % IR SOLN
Status: DC | PRN
  Administered 2021-12-08: 1000 mL

## 2021-12-08 MED ORDER — ACETAMINOPHEN 500 MG PO TABS
1000.0000 mg | ORAL_TABLET | Freq: Once | ORAL | Status: AC
  Administered 2021-12-08: 08:00:00 1000 mg via ORAL
  Filled 2021-12-08: qty 2

## 2021-12-08 MED ORDER — MIDAZOLAM HCL 5 MG/5ML IJ SOLN
INTRAMUSCULAR | Status: DC | PRN
  Administered 2021-12-08: 2 mg via INTRAVENOUS

## 2021-12-08 MED ORDER — LIDOCAINE 2% (20 MG/ML) 5 ML SYRINGE
INTRAMUSCULAR | Status: DC | PRN
  Administered 2021-12-08: 60 mg via INTRAVENOUS

## 2021-12-08 SURGICAL SUPPLY — 40 items
BAG COUNTER SPONGE SURGICOUNT (BAG) IMPLANT
BAG SPNG CNTER NS LX DISP (BAG)
BAG SURGICOUNT SPONGE COUNTING (BAG)
BLADE SURG 15 STRL LF DISP TIS (BLADE) ×1 IMPLANT
BLADE SURG 15 STRL SS (BLADE)
BUR CROSS CUT FISSURE 1.6 (BURR) ×2 IMPLANT
BUR CROSS CUT FISSURE 1.6MM (BURR) ×1
BUR EGG ELITE 4.0 (BURR) ×1 IMPLANT
BUR EGG ELITE 4.0MM (BURR)
CANISTER SUCT 3000ML PPV (MISCELLANEOUS) ×3 IMPLANT
COVER SURGICAL LIGHT HANDLE (MISCELLANEOUS) ×3 IMPLANT
DECANTER SPIKE VIAL GLASS SM (MISCELLANEOUS) ×1 IMPLANT
DRAPE U-SHAPE 76X120 STRL (DRAPES) ×1 IMPLANT
GAUZE PACKING FOLDED 2  STR (GAUZE/BANDAGES/DRESSINGS) ×3
GAUZE PACKING FOLDED 2 STR (GAUZE/BANDAGES/DRESSINGS) ×1 IMPLANT
GLOVE SURG ENC MOIS LTX SZ6.5 (GLOVE) IMPLANT
GLOVE SURG ENC MOIS LTX SZ7 (GLOVE) IMPLANT
GLOVE SURG ENC MOIS LTX SZ8 (GLOVE) ×3 IMPLANT
GLOVE SURG UNDER POLY LF SZ6.5 (GLOVE) IMPLANT
GLOVE SURG UNDER POLY LF SZ7 (GLOVE) IMPLANT
GOWN STRL REUS W/ TWL LRG LVL3 (GOWN DISPOSABLE) ×1 IMPLANT
GOWN STRL REUS W/ TWL XL LVL3 (GOWN DISPOSABLE) ×1 IMPLANT
GOWN STRL REUS W/TWL LRG LVL3 (GOWN DISPOSABLE) ×3
GOWN STRL REUS W/TWL XL LVL3 (GOWN DISPOSABLE) ×6
IV NS 1000ML (IV SOLUTION) ×3
IV NS 1000ML BAXH (IV SOLUTION) ×1 IMPLANT
KIT BASIN OR (CUSTOM PROCEDURE TRAY) ×3 IMPLANT
KIT TURNOVER KIT B (KITS) ×3 IMPLANT
NDL HYPO 25GX1X1/2 BEV (NEEDLE) ×2 IMPLANT
NEEDLE HYPO 25GX1X1/2 BEV (NEEDLE) ×3 IMPLANT
NS IRRIG 1000ML POUR BTL (IV SOLUTION) ×3 IMPLANT
PAD ARMBOARD 7.5X6 YLW CONV (MISCELLANEOUS) ×3 IMPLANT
SLEEVE IRRIGATION ELITE 7 (MISCELLANEOUS) ×3 IMPLANT
SPONGE SURGIFOAM ABS GEL 12-7 (HEMOSTASIS) IMPLANT
SUT CHROMIC 3 0 PS 2 (SUTURE) ×5 IMPLANT
SYR BULB IRRIG 60ML STRL (SYRINGE) ×3 IMPLANT
SYR CONTROL 10ML LL (SYRINGE) ×3 IMPLANT
TRAY ENT MC OR (CUSTOM PROCEDURE TRAY) ×3 IMPLANT
TUBING IRRIGATION (MISCELLANEOUS) ×3 IMPLANT
YANKAUER SUCT BULB TIP NO VENT (SUCTIONS) ×3 IMPLANT

## 2021-12-08 NOTE — Op Note (Signed)
NAME: Logan Rodriguez, Logan Rodriguez MEDICAL RECORD NO: [id number] ACCOUNT NO: [id number] DATE OF BIRTH: 1976-07-22 FACILITY: [facility] LOCATION: [facility] PHYSICIAN: Gae Bon, DDS  Operative Report   DATE OF PROCEDURE: 12/08/2021   PREOPERATIVE DIAGNOSIS:  Impacted teeth 1, 16 and 32.  POSTOPERATIVE DIAGNOSIS:  Impacted teeth 1, 16 and 32.  PROCEDURE:  Extraction of teeth 1, 16 and 32.  SURGEON:  Dr. Diona Browner.  ANESTHESIA:  Retta Diones attending.   Nasal intubation.  DESCRIPTION OF PROCEDURE:  The patient was taken to the OR, placed on the table in supine position.  General anesthesia was administered and nasoendotracheal tube was placed and secured.  The eyes were protected and the patient was draped for surgery.   Timeout was performed.  The posterior pharynx was suctioned and a throat pack was placed.  2% lidocaine 1:100,000 epinephrine was infiltrated around teeth #1 and 16, buccally and palatally and an inferior alveolar nerve block in the right mandible.  A  bite block was placed in the right side of the mouth.  A sweetheart retractor was used to retract the tongue.  A #15 blade was used to make an incision overlying tooth #16 carried forward in the buccal sulcus until tooth #13 was encountered.  The buccal  envelope flap was reflected.  Bone was removed with a Stryker handpiece around tooth #16.  Using a variety of different elevators, the tooth was elevated, but fractured upon attempted elevation.  One portion of the tooth was removed, but additional bone  had to be removed prior to removing the roots of the tooth.  Once the roots were finally removed, the area was then curetted, irrigated and closed with 3-0 chromic.  The bite block was repositioned as was a sweetheart and the right side was operated.  A  15 blade was used to make an incision around tooth #32 carried forward in the gingival sulcus to the embrasure between teeth #30 and 31.  The envelope flap was reflected  with a periosteal elevator.  Bone was removed from around the tooth in the buccal  aspect.  The tooth was then sectioned and removed in multiple pieces.  The socket was curetted, irrigated and closed with 3-0 chromic.  Then, a 15 blade was used to make an incision overlying tooth #1 carried forward into the embrasure between 3 and 4.   The flap was reflected and bone was removed overlying tooth #1, multiple extra forceps were used and elevators to remove tooth #1 eventually, it was removed by luxating from the palatal.  Then, the tooth was grasped with the forceps and removed.  The  socket was then irrigated, curetted and closed with 3-0 chromic.  Then, the oral cavity was irrigated and suctioned.  Additional local was administered.  Then the throat pack was removed and the patient was met under the care of anesthesia for extubation  with plans for discharge transfer date of surgery.  ESTIMATED BLOOD LOSS:  Minimal.  COMPLICATIONS:  None.  SPECIMENS:  None.     Elián.Darby D: 12/08/2021 10:25:22 am T: 12/08/2021 11:42:00 pm  JOB: [id number]/ [id number]

## 2021-12-08 NOTE — Op Note (Signed)
12/08/2021  10:18 AM  PATIENT:  Logan Rodriguez  45 y.o. male  PRE-OPERATIVE DIAGNOSIS:  IMPACTED TEETH #1, 16, 32  POST-OPERATIVE DIAGNOSIS:  SAME  PROCEDURE:  Procedure(s): DENTAL EXTRACTIONS TEETH #1, 82, 32  SURGEON:  Surgeon(s): Barbette Merino, Acupuncturist, DMD  ANESTHESIA:   local and general  EBL:  minimal  DRAINS: none   SPECIMEN:  No Specimen  COUNTS:  YES  PLAN OF CARE: Discharge to home after PACU  PATIENT DISPOSITION:  PACU - hemodynamically stable.   PROCEDURE DETAILS: Dictation #[id number]  [name], DMD 12/08/2021 10:18 AM

## 2021-12-08 NOTE — Anesthesia Preprocedure Evaluation (Addendum)
Anesthesia Evaluation  Patient identified by MRN, date of birth, ID band Patient awake    Reviewed: Allergy & Precautions, H&P , NPO status , Patient's Chart, lab work & pertinent test results  Airway Mallampati: II  TM Distance: >3 FB Neck ROM: Full    Dental no notable dental hx. (+) Teeth Intact, Dental Advisory Given   Pulmonary neg pulmonary ROS, Patient abstained from smoking., former smoker,    Pulmonary exam normal breath sounds clear to auscultation       Cardiovascular negative cardio ROS   Rhythm:Regular Rate:Normal     Neuro/Psych negative neurological ROS  negative psych ROS   GI/Hepatic negative GI ROS, Neg liver ROS,   Endo/Other  negative endocrine ROS  Renal/GU negative Renal ROS  negative genitourinary   Musculoskeletal   Abdominal   Peds  Hematology negative hematology ROS (+)   Anesthesia Other Findings   Reproductive/Obstetrics negative OB ROS                            Anesthesia Physical Anesthesia Plan  ASA: 2  Anesthesia Plan: General   Post-op Pain Management: Tylenol PO (pre-op) and Toradol IV (intra-op)   Induction:   PONV Risk Score and Plan: 3 and Ondansetron, Dexamethasone and Midazolam  Airway Management Planned: Nasal ETT and Video Laryngoscope Planned  Additional Equipment:   Intra-op Plan:   Post-operative Plan: Extubation in OR  Informed Consent: I have reviewed the patients History and Physical, chart, labs and discussed the procedure including the risks, benefits and alternatives for the proposed anesthesia with the patient or authorized representative who has indicated his/her understanding and acceptance.     Dental advisory given  Plan Discussed with: CRNA  Anesthesia Plan Comments:         Anesthesia Quick Evaluation

## 2021-12-08 NOTE — H&P (Signed)
H&P documentation  -History and Physical Reviewed  -Patient has been re-examined  -No change in the plan of care  Logan Rodriguez  

## 2021-12-08 NOTE — Transfer of Care (Signed)
Immediate Anesthesia Transfer of Care Note  Patient: Logan Rodriguez  Procedure(s) Performed: DENTAL RESTORATION/EXTRACTIONS (Mouth)  Patient Location: PACU  Anesthesia Type:General  Level of Consciousness: awake, alert  and oriented  Airway & Oxygen Therapy: Patient Spontanous Breathing and Patient connected to face mask oxygen  Post-op Assessment: Report given to RN and Post -op Vital signs reviewed and stable  Post vital signs: Reviewed and stable  Last Vitals:  Vitals Value Taken Time  BP 159/93 12/08/21 1026  Temp 36.6 C 12/08/21 1025  Pulse 89 12/08/21 1028  Resp 23 12/08/21 1028  SpO2 98 % 12/08/21 1028  Vitals shown include unvalidated device data.  Last Pain:  Vitals:   12/08/21 0719  TempSrc:   PainSc: 0-No pain         Complications: No notable events documented.

## 2021-12-08 NOTE — Anesthesia Postprocedure Evaluation (Signed)
Anesthesia Post Note  Patient: Logan Rodriguez  Procedure(s) Performed: DENTAL RESTORATION/EXTRACTIONS (Mouth)     Patient location during evaluation: PACU Anesthesia Type: General Level of consciousness: awake and alert Pain management: pain level controlled Vital Signs Assessment: post-procedure vital signs reviewed and stable Respiratory status: spontaneous breathing, nonlabored ventilation and respiratory function stable Cardiovascular status: blood pressure returned to baseline and stable Postop Assessment: no apparent nausea or vomiting Anesthetic complications: no   No notable events documented.  Last Vitals:  Vitals:   12/08/21 1200 12/08/21 1210  BP: (!) 150/101 (!) 151/93  Pulse: 72 73  Resp: 18 18  Temp:    SpO2:  98%    Last Pain:  Vitals:   12/08/21 1200  TempSrc:   PainSc: 3                  Monisha Siebel,W. EDMOND

## 2021-12-08 NOTE — Anesthesia Procedure Notes (Signed)
Procedure Name: Intubation Date/Time: 12/08/2021 9:38 AM Performed by: Terrence Dupont, CRNA Pre-anesthesia Checklist: Patient identified, Emergency Drugs available, Suction available and Patient being monitored Patient Re-evaluated:Patient Re-evaluated prior to induction Oxygen Delivery Method: Circle system utilized Preoxygenation: Pre-oxygenation with 100% oxygen Induction Type: IV induction Ventilation: Mask ventilation without difficulty Laryngoscope Size: Mac, Glidescope and 3 Grade View: Grade I Nasal Tubes: Nasal prep performed and Nasal Rae Tube size: 7.5 mm Number of attempts: 1 Placement Confirmation: ETT inserted through vocal cords under direct vision, positive ETCO2 and breath sounds checked- equal and bilateral Tube secured with: Tape Dental Injury: Teeth and Oropharynx as per pre-operative assessment

## 2021-12-09 ENCOUNTER — Encounter (HOSPITAL_COMMUNITY): Payer: Self-pay | Admitting: Oral Surgery

## 2023-06-25 ENCOUNTER — Emergency Department (HOSPITAL_BASED_OUTPATIENT_CLINIC_OR_DEPARTMENT_OTHER)
Admission: EM | Admit: 2023-06-25 | Discharge: 2023-06-25 | Disposition: A | Discharge status: Home / Self Care | Payer: Medicare (Managed Care) | Attending: Emergency Medicine | Admitting: Emergency Medicine

## 2023-06-25 ENCOUNTER — Other Ambulatory Visit: Payer: Self-pay

## 2023-06-25 ENCOUNTER — Encounter (HOSPITAL_BASED_OUTPATIENT_CLINIC_OR_DEPARTMENT_OTHER): Payer: Self-pay

## 2023-06-25 DIAGNOSIS — L02412 Cutaneous abscess of left axilla: Secondary | ICD-10-CM

## 2023-06-25 MED ORDER — HYDROCODONE-ACETAMINOPHEN 5-325 MG PO TABS: 2.0000 | ORAL_TABLET | ORAL | 0 refills | Status: AC | PRN

## 2023-06-25 MED ORDER — CHLORHEXIDINE GLUCONATE 4 % EX SOLN
Freq: Every day | CUTANEOUS | 0 refills | Status: AC
Start: 2023-06-25 — End: ?

## 2023-06-25 MED ORDER — CLINDAMYCIN PHOSPHATE 1 % EX GEL
Freq: Two times a day (BID) | CUTANEOUS | 0 refills | Status: AC
Start: 2023-06-25 — End: ?

## 2023-06-25 NOTE — ED Triage Notes (Signed)
Pt to er, pt states that he has had an abscess for the past 5 days, states that he has been on abx for the past 4 days, states that he went to urgent care and they attempted to I and D the abscess without success. Pt had dressing in place.

## 2023-06-25 NOTE — ED Provider Notes (Signed)
[place] [facility] AT [facility] Provider Note   CSN: [id number] Arrival date & time: 06/25/23  7829     History  Chief Complaint  Patient presents with   Abscess    Logan Rodriguez is a 47 y.o. male presents to the ED with complaint of left axilla abscess.  Patient went to urgent care and they attempted to I&D the abscess without success.  Patient has been on oral clindamycin for the past 4 days.  Patient has a history of developing multiple abscesses in his axilla and groin, but does not have an official diagnosis of hidradenitis suppurativa.  He states he has not been using anything topically.  He has been using warm compresses but has not had any spontaneous drainage of his abscess.  Denies fever, chills, rash.       Home Medications Prior to Admission medications   Medication Sig Start Date End Date Taking? Authorizing Provider  chlorhexidine (HIBICLENS) 4 % external liquid Apply topically daily. 06/25/23  Yes Zakhari Fogel R, PA-C  clindamycin (CLINDAGEL) 1 % gel Apply topically 2 (two) times daily. 06/25/23  Yes Elfida Shimada R, PA-C  HYDROcodone-acetaminophen (NORCO/VICODIN) 5-325 MG tablet Take 2 tablets by mouth every 4 (four) hours as needed. 06/25/23  Yes Richard Ritchey R, PA-C      Allergies    Doxycycline and Septra [sulfamethoxazole-trimethoprim]    Review of Systems   Review of Systems  Constitutional:  Negative for chills and fever.  Skin:  Negative for rash.       Axilla abscess    Physical Exam Updated Vital Signs BP (!) 146/100 (BP Location: Right Arm)   Pulse 62   Temp 98 F (36.7 C) (Oral)   Resp 17   Ht 5\' 7"  (1.702 m)   Wt 99.8 kg   SpO2 100%   BMI 34.46 kg/m  Physical Exam Vitals and nursing note reviewed.  Constitutional:      General: He is not in acute distress.    Appearance: Normal appearance. He is not ill-appearing or diaphoretic.  Cardiovascular:     Rate and Rhythm: Normal rate and regular rhythm.   Pulmonary:     Effort: Pulmonary effort is normal.  Skin:    General: Skin is warm and dry.     Capillary Refill: Capillary refill takes less than 2 seconds.     Comments: Approximately 5 cm erythematous, firm mass to the left axilla and another smaller erythematous, firm mass that is approximately 1.5 cm.  There are palpable tracts along the axilla.  Scars from prior I&Ds present.  No palpable fluctuance.  Surrounding tissue indurated.    Neurological:     Mental Status: He is alert. Mental status is at baseline.  Psychiatric:        Mood and Affect: Mood normal.        Behavior: Behavior normal.     ED Results / Procedures / Treatments   Labs (all labs ordered are listed, but only abnormal results are displayed) Labs Reviewed - No data to display  EKG None  Radiology No results found.  Procedures Procedures    Medications Ordered in ED Medications - No data to display  ED Course/ Medical Decision Making/ A&P                             Medical Decision Making  This patient presents to the ED with chief complaint(s) of abscess in  axilla with pertinent past medical history of multiple abscesses.  The complaint involves an extensive differential diagnosis and also carries with it a high risk of complications and morbidity.    The differential diagnosis includes hidradenitis suppurativa, simple abscess, complex abscess, cellulitis  Initial Assessment:   Approximately 5 cm erythematous, firm mass to the left axilla and another smaller erythematous, firm mass that is approximately 1.5 cm.  There are palpable tracts along the axilla.  Scars from prior I&Ds present.  No palpable fluctuance.  Surrounding tissue indurated.  Areas are very tender to palpation.  Treatment and Reassessment: I assessed the abscesses with bedside ultrasound and found complex abscesses with tracts consistent with probable hidradenitis suppurativa.  Will not perform I&D as patient did have attempt  already made at urgent care over the largest part of the abscess.  I&D will likely not be beneficial for patient.  Disposition:   Will send patient home on a short course of pain medicine to help with severe pain related to abscess.  Will also send him home on topical clindamycin and Hibiclens.  Patient referred to the hidradenitis suppurativa clinic.  The patient has been appropriately medically screened and/or stabilized in the ED. I have low suspicion for any other emergent medical condition which would require further screening, evaluation or treatment in the ED or require inpatient management. At time of discharge the patient is hemodynamically stable and in no acute distress. I have discussed work-up results and diagnosis with patient and answered all questions. Patient is agreeable with discharge plan. We discussed strict return precautions for returning to the emergency department and they verbalized understanding.             Final Clinical Impression(s) / ED Diagnoses Final diagnoses:  Abscess of axilla, left    Rx / DC Orders ED Discharge Orders          Ordered    chlorhexidine (HIBICLENS) 4 % external liquid  Daily        06/25/23 1215    clindamycin (CLINDAGEL) 1 % gel  2 times daily        06/25/23 1215    HYDROcodone-acetaminophen (NORCO/VICODIN) 5-325 MG tablet  Every 4 hours PRN        06/25/23 1215              Lenard Simmer, PA-C 06/25/23 1217    Tanda Rockers A, DO 06/26/23 [phone]

## 2023-06-25 NOTE — Discharge Instructions (Addendum)
Thank you for allowing [name] to be part of your care today.  You were evaluated in the ED for abscess in your left axilla.  Based on physical exam findings and bedside ultrasound, I suspect that you may have hidradenitis suppurativa.  We avoid incision and drainage on these types of abscesses to help prevent scarring and further abscess formation.  I am referring you over to a dermatologist that has a hidradenitis clinic.  Please call the number provided and asked to schedule an appointment with them.  I am sending you home on topical clindamycin ointment to use twice daily.  Please continue to take your oral clindamycin as prescribed.  I am also sending you home on Hibiclens which you will use during every shower.  Use this special soap in your armpits and groin region to help prevent further abscess formation.  I have also sent over a short course of pain medicine to use for severe pain.  I recommend taking Tylenol and/or ibuprofen as needed for mild to moderate pain.  You may also continue using warm compresses to help encourage drainage of your abscess.   Return to the ED if you develop sudden worsening of your symptoms or if you have any new concerns.

## 2023-06-25 NOTE — ED Notes (Signed)
Pt discharged to home using teachback Method. Discharge instructions have been discussed with patient and/or family members. Pt verbally acknowledges understanding d/c instructions, has been given opportunity for questions to be answered, and endorses comprehension to checkout at registration before leaving.  

## 2023-07-20 ENCOUNTER — Ambulatory Visit: Payer: Medicare (Managed Care) | Admitting: Podiatry

## 2023-09-25 ENCOUNTER — Ambulatory Visit (HOSPITAL_COMMUNITY)
Admission: EM | Admit: 2023-09-25 | Discharge: 2023-09-25 | Disposition: A | Discharge status: Home / Self Care | Payer: Medicare (Managed Care) | Attending: Internal Medicine | Admitting: Internal Medicine

## 2023-09-25 ENCOUNTER — Encounter (HOSPITAL_COMMUNITY): Payer: Self-pay | Admitting: Emergency Medicine

## 2023-09-25 ENCOUNTER — Other Ambulatory Visit: Payer: Self-pay

## 2023-09-25 DIAGNOSIS — R5081 Fever presenting with conditions classified elsewhere: Secondary | ICD-10-CM | POA: Insufficient documentation

## 2023-09-25 DIAGNOSIS — Z87891 Personal history of nicotine dependence: Secondary | ICD-10-CM | POA: Insufficient documentation

## 2023-09-25 DIAGNOSIS — Z8616 Personal history of COVID-19: Secondary | ICD-10-CM | POA: Diagnosis not present

## 2023-09-25 DIAGNOSIS — Z1152 Encounter for screening for COVID-19: Secondary | ICD-10-CM | POA: Diagnosis not present

## 2023-09-25 DIAGNOSIS — G4489 Other headache syndrome: Secondary | ICD-10-CM | POA: Diagnosis not present

## 2023-09-25 NOTE — ED Triage Notes (Signed)
Pt c/o high fever for the past 2 days, states he is been on abx for the past 4 days for an abscess.

## 2023-09-25 NOTE — ED Provider Notes (Signed)
[facility]    CSN: [id number] Arrival date & time: 09/25/23  1353      History   Chief Complaint Chief Complaint  Patient presents with   Fever    HPI CHAYNCE SCHAFER is a 47 y.o. male.   47 yr old male who presents to urgent care with 2 day h/o of fevers and chills. Has had chills for 2 days and then started running a fever last night of 102.6. Also having severe headaches.  He felt like this last time he had COVID.  He was treated with Paxlovid at that time.. Denies sick contacts. Denies urinary symptoms, congestion, cough. Did start antibiotics for right axillary abscess from HS. Started doxycycline 3 days.    Fever Associated symptoms: headaches   Associated symptoms: no chest pain, no chills, no cough, no dysuria, no ear pain, no rash, no sore throat and no vomiting     Past Medical History:  Diagnosis Date   Abscess     Patient Active Problem List   Diagnosis Date Noted   Abscess of perineum 07/15/2016   Perineal abscess 12/11/2015    Past Surgical History:  Procedure Laterality Date   INCISION AND DRAINAGE PERIRECTAL ABSCESS Left 12/11/2015   Procedure: IRRIGATION AND DEBRIDEMENT PERINEAL ABSCESS;  Surgeon: Chevis Pretty III, MD;  Location: [facility] OR;  Service: General;  Laterality: Left;   IRRIGATION AND DEBRIDEMENT ABSCESS N/A 07/16/2016   Procedure: INCISION AND DRAINAGE AND OPEN PACKING PERINEAL AND SCROTAL ABSCESS;  Surgeon: Darnell Level, MD;  Location: [facility];  Service: General;  Laterality: N/A;   TOOTH EXTRACTION N/A 12/08/2021   Procedure: DENTAL RESTORATION/EXTRACTIONS;  Surgeon: Ocie Doyne, DMD;  Location: [facility] OR;  Service: Oral Surgery;  Laterality: N/A;       Home Medications    Prior to Admission medications   Medication Sig Start Date End Date Taking? Authorizing Provider  chlorhexidine (HIBICLENS) 4 % external liquid Apply topically daily. 06/25/23   Clark, Meghan R, PA-C  clindamycin (CLINDAGEL) 1 % gel Apply topically 2 (two) times  daily. 06/25/23   Clark, Meghan R, PA-C  HYDROcodone-acetaminophen (NORCO/VICODIN) 5-325 MG tablet Take 2 tablets by mouth every 4 (four) hours as needed. 06/25/23   Lenard Simmer, PA-C    Family History History reviewed. No pertinent family history.  Social History Social History   Tobacco Use   Smoking status: Former    Types: Cigarettes   Smokeless tobacco: Never  Vaping Use   Vaping status: Never Used  Substance Use Topics   Alcohol use: Yes    Comment: occ   Drug use: Yes    Frequency: 4.0 times per week    Types: Marijuana     Allergies   Doxycycline and Septra [sulfamethoxazole-trimethoprim]   Review of Systems Review of Systems  Constitutional:  Positive for fever. Negative for chills.  HENT:  Negative for ear pain and sore throat.   Eyes:  Negative for pain and visual disturbance.  Respiratory:  Negative for cough and shortness of breath.   Cardiovascular:  Negative for chest pain and palpitations.  Gastrointestinal:  Negative for abdominal pain and vomiting.  Genitourinary:  Negative for dysuria and hematuria.  Musculoskeletal:  Negative for arthralgias and back pain.  Skin:  Negative for color change and rash.  Neurological:  Positive for headaches. Negative for seizures and syncope.  All other systems reviewed and are negative.    Physical Exam Triage Vital Signs ED Triage Vitals  Encounter Vitals Group  BP 09/25/23 1405 (!) 144/80     Systolic BP Percentile --      Diastolic BP Percentile --      Pulse Rate 09/25/23 1405 89     Resp 09/25/23 1405 18     Temp 09/25/23 1405 (!) 102.8 F (39.3 C)     Temp Source 09/25/23 1405 Oral     SpO2 09/25/23 1405 99 %     Weight --      Height --      Head Circumference --      Peak Flow --      Pain Score 09/25/23 1406 0     Pain Loc --      Pain Education --      Exclude from Growth Chart --    No data found.  Updated Vital Signs BP (!) 144/80 (BP Location: Right Arm)   Pulse 89   Temp (!)  102.8 F (39.3 C) (Oral)   Resp 18   SpO2 99%   Visual Acuity Right Eye Distance:   Left Eye Distance:   Bilateral Distance:    Right Eye Near:   Left Eye Near:    Bilateral Near:     Physical Exam Vitals and nursing note reviewed.  Constitutional:      General: He is not in acute distress.    Appearance: He is well-developed.  HENT:     Head: Normocephalic and atraumatic.  Eyes:     Conjunctiva/sclera: Conjunctivae normal.  Cardiovascular:     Rate and Rhythm: Normal rate and regular rhythm.     Heart sounds: No murmur heard. Pulmonary:     Effort: Pulmonary effort is normal. No respiratory distress.     Breath sounds: Normal breath sounds.  Abdominal:     Palpations: Abdomen is soft.     Tenderness: There is no abdominal tenderness.  Musculoskeletal:        General: No swelling.     Cervical back: Neck supple.  Skin:    General: Skin is warm and dry.     Capillary Refill: Capillary refill takes less than 2 seconds.       Neurological:     Mental Status: He is alert.  Psychiatric:        Mood and Affect: Mood normal.      UC Treatments / Results  Labs (all labs ordered are listed, but only abnormal results are displayed) Labs Reviewed - No data to display  EKG   Radiology No results found.  Procedures Procedures (including critical care time)  Medications Ordered in UC Medications - No data to display  Initial Impression / Assessment and Plan / UC Course  I have reviewed the triage vital signs and the nursing notes.  Pertinent labs & imaging results that were available during my care of the patient were reviewed by me and considered in my medical decision making (see chart for details).     Fever in other diseases  Other headache syndrome   Symptoms likely COVID related and will swab today.  Did not think that the abscesses in the axilla are severe enough to cause febrile symptoms as these appear to be resolving and he is on antibiotics for  this.  You have been swabbed for COVID, and the test will result in the next 24 hours. Our staff will call you if positive. If the COVID test is positive, you should quarantine until you are fever free for 24 hours and you are starting  to feel better, and then take added precautions for the next 5 days, such as physical distancing/wearing a mask and good hand hygiene/washing. If positive we could consider prescribing Paxlovid.  Final Clinical Impressions(s) / UC Diagnoses   Final diagnoses:  None   Discharge Instructions   None    ED Prescriptions   None    PDMP not reviewed this encounter.   Landis Martins, [place] 09/25/23 1436

## 2023-09-25 NOTE — Discharge Instructions (Addendum)
You have been swabbed for COVID, and the test will result in the next 24 hours. Our staff will call you if positive. If the COVID test is positive, you should quarantine until you are fever free for 24 hours and you are starting to feel better, and then take added precautions for the next 5 days, such as physical distancing/wearing a mask and good hand hygiene/washing. If positive we could consider prescribing Paxlovid.

## 2023-09-25 NOTE — ED Triage Notes (Signed)
Pt states took Tylenol at 9 am for fever.

## 2023-09-26 LAB — SARS CORONAVIRUS 2 (TAT 6-24 HRS): SARS Coronavirus 2: NEGATIVE
